# Patient Record
Sex: Female | Born: 1955 | Race: White | Hispanic: No | Smoking: Never smoker
Health system: Southern US, Community
[De-identification: ages and names within clinical notes are randomized; demographics above are authoritative.]

## PROBLEM LIST (undated history)

## (undated) DIAGNOSIS — I200 Unstable angina: Secondary | ICD-10-CM

## (undated) DIAGNOSIS — D649 Anemia, unspecified: Secondary | ICD-10-CM

## (undated) DIAGNOSIS — M1990 Unspecified osteoarthritis, unspecified site: Secondary | ICD-10-CM

## (undated) DIAGNOSIS — E785 Hyperlipidemia, unspecified: Secondary | ICD-10-CM

## (undated) DIAGNOSIS — K5900 Constipation, unspecified: Secondary | ICD-10-CM

## (undated) DIAGNOSIS — M549 Dorsalgia, unspecified: Secondary | ICD-10-CM

## (undated) DIAGNOSIS — I10 Essential (primary) hypertension: Secondary | ICD-10-CM

## (undated) DIAGNOSIS — I639 Cerebral infarction, unspecified: Secondary | ICD-10-CM

## (undated) DIAGNOSIS — H269 Unspecified cataract: Secondary | ICD-10-CM

## (undated) HISTORY — DX: Unspecified cataract: H26.9

## (undated) HISTORY — DX: Cerebral infarction, unspecified: I63.9

## (undated) HISTORY — DX: Essential (primary) hypertension: I10

## (undated) HISTORY — DX: Unspecified osteoarthritis, unspecified site: M19.90

## (undated) HISTORY — DX: Unstable angina: I20.0

## (undated) HISTORY — DX: Anemia, unspecified: D64.9

## (undated) HISTORY — DX: Constipation, unspecified: K59.00

## (undated) HISTORY — DX: Hyperlipidemia, unspecified: E78.5

## (undated) HISTORY — PX: APPENDECTOMY: SHX54

## (undated) HISTORY — DX: Dorsalgia, unspecified: M54.9

---

## 2018-03-22 DIAGNOSIS — M9903 Segmental and somatic dysfunction of lumbar region: Secondary | ICD-10-CM | POA: Diagnosis not present

## 2018-03-22 DIAGNOSIS — M9902 Segmental and somatic dysfunction of thoracic region: Secondary | ICD-10-CM | POA: Diagnosis not present

## 2018-03-22 DIAGNOSIS — M5137 Other intervertebral disc degeneration, lumbosacral region: Secondary | ICD-10-CM | POA: Diagnosis not present

## 2018-03-22 DIAGNOSIS — M5432 Sciatica, left side: Secondary | ICD-10-CM | POA: Diagnosis not present

## 2018-03-22 DIAGNOSIS — S29012A Strain of muscle and tendon of back wall of thorax, initial encounter: Secondary | ICD-10-CM | POA: Diagnosis not present

## 2018-03-22 DIAGNOSIS — M9905 Segmental and somatic dysfunction of pelvic region: Secondary | ICD-10-CM | POA: Diagnosis not present

## 2018-03-30 DIAGNOSIS — M9905 Segmental and somatic dysfunction of pelvic region: Secondary | ICD-10-CM | POA: Diagnosis not present

## 2018-03-30 DIAGNOSIS — S29012A Strain of muscle and tendon of back wall of thorax, initial encounter: Secondary | ICD-10-CM | POA: Diagnosis not present

## 2018-03-30 DIAGNOSIS — M5432 Sciatica, left side: Secondary | ICD-10-CM | POA: Diagnosis not present

## 2018-03-30 DIAGNOSIS — M5137 Other intervertebral disc degeneration, lumbosacral region: Secondary | ICD-10-CM | POA: Diagnosis not present

## 2018-03-30 DIAGNOSIS — M9902 Segmental and somatic dysfunction of thoracic region: Secondary | ICD-10-CM | POA: Diagnosis not present

## 2018-03-30 DIAGNOSIS — M9903 Segmental and somatic dysfunction of lumbar region: Secondary | ICD-10-CM | POA: Diagnosis not present

## 2018-04-07 DIAGNOSIS — M9905 Segmental and somatic dysfunction of pelvic region: Secondary | ICD-10-CM | POA: Diagnosis not present

## 2018-04-07 DIAGNOSIS — M9902 Segmental and somatic dysfunction of thoracic region: Secondary | ICD-10-CM | POA: Diagnosis not present

## 2018-04-07 DIAGNOSIS — M5137 Other intervertebral disc degeneration, lumbosacral region: Secondary | ICD-10-CM | POA: Diagnosis not present

## 2018-04-07 DIAGNOSIS — M5432 Sciatica, left side: Secondary | ICD-10-CM | POA: Diagnosis not present

## 2018-04-07 DIAGNOSIS — S29012A Strain of muscle and tendon of back wall of thorax, initial encounter: Secondary | ICD-10-CM | POA: Diagnosis not present

## 2018-04-07 DIAGNOSIS — M9903 Segmental and somatic dysfunction of lumbar region: Secondary | ICD-10-CM | POA: Diagnosis not present

## 2018-04-09 NOTE — H&P (View-Only) (Signed)
Cardiology Office Note   Date:  04/10/2018   ID:  Vicki Hall, DOB 11-07-1955, MRN [id number]  PCP:  Patient, No Pcp Per  Cardiologist:   No primary care provider on file. Referring:  Self  Chief Complaint  Patient presents with  . Chest Pain      History of Present Illness: Vicki Hall is a 62 y.o. female who presents for evaluation  of chest pain and abnormal stress test.  She is from [place] and this history was obtained through an interpreter speaking Arabic.  She is had a history of chest discomfort and she brings with her a stress test dating back to 2012 which suggested depression in the lateral leads positive for ischemia.  There was a well-preserved ejection fraction.  There was a positive result for hypertension.  This study did not suggest ischemia on perfusion testing but subsequent testing done last year did although I do not have these records.  She does have an abnormal EKG as described below.  This was done to evaluate chest discomfort and tiredness.  He gets a discomfort in her chest with activities or sometimes at rest.  It is difficult to quantify or qualify.  She is had numbness in her left hand as well.  She is very weak trying to do activities and this is been progressive.  She might get short of breath.  Is not a description of exercise-induced nausea vomiting or diaphoresis.  Does not seem to be radiation to the jaw.  She was told that she needed cardiac catheterization.  Her children live here and she wanted to have it done in the [place].     Past Medical History:  Diagnosis Date  . Back pain   . Hypertension    Past Surgical History:  Procedure Laterality Date  . APPENDECTOMY       Current Outpatient Medications  Medication Sig Dispense Refill  . bisoprolol-hydrochlorothiazide (ZIAC) 2.5-6.25 MG tablet Take 2 tablets by mouth daily.    . nitroGLYCERIN 2.5 MG CR capsule Take 2.5 mg by mouth daily.     No current facility-administered medications for this  visit.     Allergies:   Patient has no allergy information on record.    Social History:  The patient  reports that she has never smoked. She has never used smokeless tobacco.   Family History:  The patient's family history includes Liver cancer in her father; Pancreatic cancer in her mother.    ROS:  Please see the history of present illness.   Otherwise, review of systems are positive for none.   All other systems are reviewed and negative.    PHYSICAL EXAM: VS:  BP 134/64   Pulse 74   Ht 5' 5.5" (1.664 m)   Wt 178 lb 12.8 oz (81.1 kg)   BMI 29.30 kg/m  , BMI Body mass index is 29.3 kg/m. GENERAL:  Well appearing HEENT:  Pupils equal round and reactive, fundi not visualized, oral mucosa unremarkable NECK:  No jugular venous distention, waveform within normal limits, carotid upstroke brisk and symmetric, no bruits, no thyromegaly LYMPHATICS:  No cervical, inguinal adenopathy LUNGS:  Clear to auscultation bilaterally BACK:  No CVA tenderness CHEST:  Unremarkable HEART:  PMI not displaced or sustained,S1 and S2 within normal limits, no S3, no S4, no clicks, no rubs, no murmurs ABD:  Flat, positive bowel sounds normal in frequency in pitch, no bruits, no rebound, no guarding, no midline pulsatile mass, no hepatomegaly, no splenomegaly  EXT:  2 plus pulses throughout, no edema, no cyanosis no clubbing SKIN:  No rashes no nodules NEURO:  Cranial nerves II through XII grossly intact, motor grossly intact throughout PSYCH:  Cognitively intact, oriented to person place and time    EKG:  EKG is ordered today. The ekg ordered today demonstrates sinus rhythm, rate 60, axis within normal limits, intervals within normal limits, anterolateral T wave inversion consistent with ischemia but without old EKG for comparison.   Recent Labs: No results found for requested labs within last 8760 hours.    Lipid Panel No results found for: CHOL, TRIG, HDL, CHOLHDL, VLDL, LDLCALC, LDLDIRECT     Wt Readings from Last 3 Encounters:  04/10/18 178 lb 12.8 oz (81.1 kg)      Other studies Reviewed: Additional studies/ records that were reviewed today include: Records from [facility] Review of the above records demonstrates:  Please see elsewhere in the note.     ASSESSMENT AND PLAN:  CHEST PAIN: She has chest discomfort, abnormal EKG and ischemia on perfusion study.  Cardiac catheterization is indicated.  She is not having active symptoms so we can do this electively.  The patient understands that risks included but are not limited to stroke (1 in 1000), death (1 in 40), kidney failure [usually temporary] (1 in 500), bleeding (1 in 200), allergic reaction [possibly serious] (1 in 200).  The patient understands and agrees to proceed.   Of note, though not   HTN:  The blood pressure is at target. No change in medications is indicated. We will continue with therapeutic lifestyle changes (TLC).  DSYLIPIDEMIA: I was able to get results from [place] and LDL was 151.  She can be started on statin based on the results of her catheterization.  I will defer until that study.  Current medicines are reviewed at length with the patient today.  The patient does not have concerns regarding medicines.  The following changes have been made:  no change  Labs/ tests ordered today include:   Orders Placed This Encounter  Procedures  . CBC  . Basic Metabolic Panel (BMET)  . TSH     Disposition:   FU with me based on the results.     Signed, Minus Breeding, MD  04/10/2018 12:40 PM    [place]

## 2018-04-09 NOTE — Progress Notes (Signed)
Cardiology Office Note   Date:  04/10/2018   ID:  Vicki Hall, DOB May 30, 1956, MRN [id number]  PCP:  Patient, No Pcp Per  Cardiologist:   No primary care provider on file. Referring:  Self  Chief Complaint  Patient presents with  . Chest Pain      History of Present Illness: Vicki Hall is a 62 y.o. female who presents for evaluation  of chest pain and abnormal stress test.  She is from [place] and this history was obtained through an interpreter speaking Arabic.  She is had a history of chest discomfort and she brings with her a stress test dating back to 2012 which suggested depression in the lateral leads positive for ischemia.  There was a well-preserved ejection fraction.  There was a positive result for hypertension.  This study did not suggest ischemia on perfusion testing but subsequent testing done last year did although I do not have these records.  She does have an abnormal EKG as described below.  This was done to evaluate chest discomfort and tiredness.  He gets a discomfort in her chest with activities or sometimes at rest.  It is difficult to quantify or qualify.  She is had numbness in her left hand as well.  She is very weak trying to do activities and this is been progressive.  She might get short of breath.  Is not a description of exercise-induced nausea vomiting or diaphoresis.  Does not seem to be radiation to the jaw.  She was told that she needed cardiac catheterization.  Her children live here and she wanted to have it done in the [place].     Past Medical History:  Diagnosis Date  . Back pain   . Hypertension    Past Surgical History:  Procedure Laterality Date  . APPENDECTOMY       Current Outpatient Medications  Medication Sig Dispense Refill  . bisoprolol-hydrochlorothiazide (ZIAC) 2.5-6.25 MG tablet Take 2 tablets by mouth daily.    . nitroGLYCERIN 2.5 MG CR capsule Take 2.5 mg by mouth daily.     No current facility-administered medications for this  visit.     Allergies:   Patient has no allergy information on record.    Social History:  The patient  reports that she has never smoked. She has never used smokeless tobacco.   Family History:  The patient's family history includes Liver cancer in her father; Pancreatic cancer in her mother.    ROS:  Please see the history of present illness.   Otherwise, review of systems are positive for none.   All other systems are reviewed and negative.    PHYSICAL EXAM: VS:  BP 134/64   Pulse 74   Ht 5' 5.5" (1.664 m)   Wt 178 lb 12.8 oz (81.1 kg)   BMI 29.30 kg/m  , BMI Body mass index is 29.3 kg/m. GENERAL:  Well appearing HEENT:  Pupils equal round and reactive, fundi not visualized, oral mucosa unremarkable NECK:  No jugular venous distention, waveform within normal limits, carotid upstroke brisk and symmetric, no bruits, no thyromegaly LYMPHATICS:  No cervical, inguinal adenopathy LUNGS:  Clear to auscultation bilaterally BACK:  No CVA tenderness CHEST:  Unremarkable HEART:  PMI not displaced or sustained,S1 and S2 within normal limits, no S3, no S4, no clicks, no rubs, no murmurs ABD:  Flat, positive bowel sounds normal in frequency in pitch, no bruits, no rebound, no guarding, no midline pulsatile mass, no hepatomegaly, no splenomegaly  EXT:  2 plus pulses throughout, no edema, no cyanosis no clubbing SKIN:  No rashes no nodules NEURO:  Cranial nerves II through XII grossly intact, motor grossly intact throughout PSYCH:  Cognitively intact, oriented to person place and time    EKG:  EKG is ordered today. The ekg ordered today demonstrates sinus rhythm, rate 60, axis within normal limits, intervals within normal limits, anterolateral T wave inversion consistent with ischemia but without old EKG for comparison.   Recent Labs: No results found for requested labs within last 8760 hours.    Lipid Panel No results found for: CHOL, TRIG, HDL, CHOLHDL, VLDL, LDLCALC, LDLDIRECT     Wt Readings from Last 3 Encounters:  04/10/18 178 lb 12.8 oz (81.1 kg)      Other studies Reviewed: Additional studies/ records that were reviewed today include: Records from [facility] Review of the above records demonstrates:  Please see elsewhere in the note.     ASSESSMENT AND PLAN:  CHEST PAIN: She has chest discomfort, abnormal EKG and ischemia on perfusion study.  Cardiac catheterization is indicated.  She is not having active symptoms so we can do this electively.  The patient understands that risks included but are not limited to stroke (1 in 1000), death (1 in 31), kidney failure [usually temporary] (1 in 500), bleeding (1 in 200), allergic reaction [possibly serious] (1 in 200).  The patient understands and agrees to proceed.   Of note, though not   HTN:  The blood pressure is at target. No change in medications is indicated. We will continue with therapeutic lifestyle changes (TLC).  DSYLIPIDEMIA: I was able to get results from [place] and LDL was 151.  She can be started on statin based on the results of her catheterization.  I will defer until that study.  Current medicines are reviewed at length with the patient today.  The patient does not have concerns regarding medicines.  The following changes have been made:  no change  Labs/ tests ordered today include:   Orders Placed This Encounter  Procedures  . CBC  . Basic Metabolic Panel (BMET)  . TSH     Disposition:   FU with me based on the results.     Signed, Minus Breeding, MD  04/10/2018 12:40 PM    [place]

## 2018-04-10 ENCOUNTER — Ambulatory Visit (INDEPENDENT_AMBULATORY_CARE_PROVIDER_SITE_OTHER): Payer: Medicaid Other | Admitting: Cardiology

## 2018-04-10 ENCOUNTER — Encounter: Payer: Self-pay | Admitting: Cardiology

## 2018-04-10 VITALS — BP 134/64 | HR 74 | Ht 65.5 in | Wt 178.8 lb

## 2018-04-10 DIAGNOSIS — R5383 Other fatigue: Secondary | ICD-10-CM | POA: Diagnosis not present

## 2018-04-10 DIAGNOSIS — I10 Essential (primary) hypertension: Secondary | ICD-10-CM | POA: Diagnosis not present

## 2018-04-10 DIAGNOSIS — Z01812 Encounter for preprocedural laboratory examination: Secondary | ICD-10-CM | POA: Diagnosis not present

## 2018-04-10 DIAGNOSIS — E785 Hyperlipidemia, unspecified: Secondary | ICD-10-CM

## 2018-04-10 DIAGNOSIS — I200 Unstable angina: Secondary | ICD-10-CM

## 2018-04-10 DIAGNOSIS — R9439 Abnormal result of other cardiovascular function study: Secondary | ICD-10-CM

## 2018-04-10 LAB — CBC
Hematocrit: 42.1 % (ref 34.0–46.6)
Hemoglobin: 14 g/dL (ref 11.1–15.9)
MCH: 26.8 pg (ref 26.6–33.0)
MCHC: 33.3 g/dL (ref 31.5–35.7)
MCV: 81 fL (ref 79–97)
PLATELETS: 290 10*3/uL (ref 150–450)
RBC: 5.23 x10E6/uL (ref 3.77–5.28)
RDW: 15.1 % (ref 12.3–15.4)
WBC: 4.4 10*3/uL (ref 3.4–10.8)

## 2018-04-10 LAB — BASIC METABOLIC PANEL
BUN/Creatinine Ratio: 20 (ref 12–28)
BUN: 19 mg/dL (ref 8–27)
CALCIUM: 10 mg/dL (ref 8.7–10.3)
CHLORIDE: 98 mmol/L (ref 96–106)
CO2: 25 mmol/L (ref 20–29)
Creatinine, Ser: 0.93 mg/dL (ref 0.57–1.00)
GFR calc Af Amer: 77 mL/min/{1.73_m2} (ref >59)
GFR calc non Af Amer: 67 mL/min/{1.73_m2} (ref >59)
GLUCOSE: 105 mg/dL — AB (ref 65–99)
Potassium: 3.4 mmol/L — ABNORMAL LOW (ref 3.5–5.2)
Sodium: 140 mmol/L (ref 134–144)

## 2018-04-10 LAB — TSH: TSH: 3.49 u[IU]/mL (ref 0.450–4.500)

## 2018-04-10 NOTE — Patient Instructions (Signed)
Medication Instructions:  Continue current medications  If you need a refill on your cardiac medications before your next appointment, please call your pharmacy.  Labwork: Pre Op Labs HERE IN OUR OFFICE AT [facility]  Take the provided lab slips with you to the lab for your blood draw.   You will NOT need to fast   Testing/Procedures: Your physician has requested that you have a cardiac catheterization. Cardiac catheterization is used to diagnose and/or treat various heart conditions. Doctors may recommend this procedure for a number of different reasons. The most common reason is to evaluate chest pain. Chest pain can be a symptom of coronary artery disease (CAD), and cardiac catheterization can show whether plaque is narrowing or blocking your heart's arteries. This procedure is also used to evaluate the valves, as well as measure the blood flow and oxygen levels in different parts of your heart. For further information please visit [email or web address]. Please follow instruction sheet, as given.   Special Instructions:    [name] [place] [address] Dept: [phone] Loc: [phone]  Vicki Hall  04/10/2018  You are scheduled for a Cardiac Catheterization on Thursday, October 3 with Dr. Harrell Gave End.  1. Please arrive at the [facility] (Main Entrance A) at [facility]: [place], [place] [address] at 7:00 AM (This time is two hours before your procedure to ensure your preparation). Free valet parking service is available.   Special note: Every effort is made to have your procedure done on time. Please understand that emergencies sometimes delay scheduled procedures.  2. Diet: Do not eat solid foods after midnight.  The patient may have clear liquids until 5am upon the day of the procedure.  3. Labs: You will need to have blood drawn on Monday,  September 30 at [place]  Open: 8am - 5pm (Lunch 12:30 - 1:30)   Phone: [phone]. You do not need to be fasting.  4. Medication instructions in preparation for your procedure:   Contrast Allergy: No    On the morning of your procedure, take your morning medicines NOT listed above.  You may use sips of water.  5. Plan for one night stay--bring personal belongings. 6. Bring a current list of your medications and current insurance cards. 7. You MUST have a responsible person to drive you home. 8. Someone MUST be with you the first 24 hours after you arrive home or your discharge will be delayed. 9. Please wear clothes that are easy to get on and off and wear slip-on shoes.  Thank you for allowing [place] to care for you!   -- [facility]   Follow-Up: Your physician wants you to follow-up in: After Cath.      Thank you for choosing [facility] at [facility]!!

## 2018-04-11 ENCOUNTER — Telehealth: Payer: Self-pay | Admitting: *Deleted

## 2018-04-11 MED ORDER — POTASSIUM CHLORIDE CRYS ER 20 MEQ PO TBCR: EXTENDED_RELEASE_TABLET | ORAL | 0 refills | Status: DC

## 2018-04-11 NOTE — Addendum Note (Signed)
Addended by: Katrine Coho on: 04/11/2018 03:49 PM   Modules accepted: Orders

## 2018-04-11 NOTE — Telephone Encounter (Signed)
I spoke with patient's daughter, London Sheer ([place]), she is unable to be off work Architectural technologist.  Dr End is aware cath has been changed back to Thursday April 13, 2018 arrive 7 AM for 9 AM cath (original date/time), Santiago Glad in [facility] has requested Arabic interpreter for this date/time.   I reviewed all instructions with London Sheer, she verbalized understanding, thanked me for call.

## 2018-04-11 NOTE — Telephone Encounter (Signed)
BMP done 04/10/18 K 3.4- Kdur 40 meq x 1 will be OK at the time of the cath. Thanks. Maylon Cos   I have spoken to pt's daughter, London Sheer, ([place]), I will send in a prescription for Kdur 40 meq (oral, confirmed with Dr Percival Spanish) for pt to take just prior to leaving for hospital morning of procedure.

## 2018-04-11 NOTE — Telephone Encounter (Signed)
I received message below from Dr End in a staff message:  Could you see if the patient is able to move her cath to tomorrow at 2 PM with Dr. Fletcher Anon? She was initially scheduled with me on Thursday morning. However, she is [place] and speak Arabic; Drs. Hochrein and Arida agree that it might be better for her to be cathed by Dr. Fletcher Anon, given that he speak Arabic.   I called  patient to reschedule cath from April 13, 2018 with Dr End to April 12, 2018 2 PM with Dr Fletcher Anon.  No answer at phone number listed, left message on voice mail for pt to call me back through [facility], [place] Gong.  Pt contacted pre-catheterization scheduled at [facility] for: Wednesday April 12, 2018 2 PM Verified arrival time and place: [place] Entrance A at: 12 noon  No solid food after midnight prior to cath, clear liquids until 5 AM day of procedure. Review allergies. Verify no diabetes medications.  Hold: Bisoprolol-HCTZ -AM of procedure.   Except hold medications AM meds can be  taken pre-cath with sip of water including: ASA 81 mg  Confirm patient has responsible person to drive home post procedure and for 24 hours after you arrive home.

## 2018-04-13 ENCOUNTER — Ambulatory Visit (HOSPITAL_COMMUNITY)
Admission: RE | Admit: 2018-04-13 | Discharge: 2018-04-13 | Disposition: A | Discharge status: Home / Self Care | Payer: Medicaid Other | Source: Ambulatory Visit | Attending: Internal Medicine | Admitting: Internal Medicine

## 2018-04-13 ENCOUNTER — Encounter (HOSPITAL_COMMUNITY)
Admission: RE | Disposition: A | Discharge status: Home / Self Care | Payer: Self-pay | Source: Ambulatory Visit | Attending: Internal Medicine

## 2018-04-13 ENCOUNTER — Other Ambulatory Visit: Payer: Self-pay

## 2018-04-13 DIAGNOSIS — Z9889 Other specified postprocedural states: Secondary | ICD-10-CM | POA: Diagnosis not present

## 2018-04-13 DIAGNOSIS — R9439 Abnormal result of other cardiovascular function study: Secondary | ICD-10-CM

## 2018-04-13 DIAGNOSIS — I200 Unstable angina: Secondary | ICD-10-CM

## 2018-04-13 DIAGNOSIS — R200 Anesthesia of skin: Secondary | ICD-10-CM | POA: Insufficient documentation

## 2018-04-13 DIAGNOSIS — I10 Essential (primary) hypertension: Secondary | ICD-10-CM | POA: Diagnosis not present

## 2018-04-13 HISTORY — PX: LEFT HEART CATH AND CORONARY ANGIOGRAPHY: CATH118249

## 2018-04-13 LAB — POTASSIUM: POTASSIUM: 2.9 mmol/L — AB (ref 3.5–5.1)

## 2018-04-13 SURGERY — LEFT HEART CATH AND CORONARY ANGIOGRAPHY
Anesthesia: LOCAL

## 2018-04-13 MED ORDER — SODIUM CHLORIDE 0.9 % IV SOLN: 250.0000 mL | INTRAVENOUS | Status: DC | PRN

## 2018-04-13 MED ORDER — SODIUM CHLORIDE 0.9% FLUSH: 3.0000 mL | INTRAVENOUS | Status: DC | PRN

## 2018-04-13 MED ORDER — SODIUM CHLORIDE 0.9% FLUSH: 3.0000 mL | Freq: Two times a day (BID) | INTRAVENOUS | Status: DC

## 2018-04-13 MED ORDER — LIDOCAINE HCL (PF) 1 % IJ SOLN
INTRAMUSCULAR | Status: DC | PRN
  Administered 2018-04-13: 2 mL

## 2018-04-13 MED ORDER — HEPARIN (PORCINE) IN NACL 1000-0.9 UT/500ML-% IV SOLN
INTRAVENOUS | Status: AC
  Filled 2018-04-13: qty 1000

## 2018-04-13 MED ORDER — SODIUM CHLORIDE 0.9 % IV SOLN: INTRAVENOUS | Status: DC

## 2018-04-13 MED ORDER — FENTANYL CITRATE (PF) 100 MCG/2ML IJ SOLN
INTRAMUSCULAR | Status: DC | PRN
  Administered 2018-04-13 (×2): 25 ug via INTRAVENOUS

## 2018-04-13 MED ORDER — POTASSIUM CHLORIDE CRYS ER 20 MEQ PO TBCR
EXTENDED_RELEASE_TABLET | ORAL | Status: AC
  Filled 2018-04-13: qty 2

## 2018-04-13 MED ORDER — FENTANYL CITRATE (PF) 100 MCG/2ML IJ SOLN
INTRAMUSCULAR | Status: AC
  Filled 2018-04-13: qty 2

## 2018-04-13 MED ORDER — SODIUM CHLORIDE 0.9 % WEIGHT BASED INFUSION: 1.0000 mL/kg/h | INTRAVENOUS | Status: DC

## 2018-04-13 MED ORDER — ACETAMINOPHEN 325 MG PO TABS: 650.0000 mg | ORAL_TABLET | ORAL | Status: DC | PRN

## 2018-04-13 MED ORDER — MIDAZOLAM HCL 2 MG/2ML IJ SOLN
INTRAMUSCULAR | Status: AC
  Filled 2018-04-13: qty 2

## 2018-04-13 MED ORDER — ONDANSETRON HCL 4 MG/2ML IJ SOLN: 4.0000 mg | Freq: Four times a day (QID) | INTRAMUSCULAR | Status: DC | PRN

## 2018-04-13 MED ORDER — MIDAZOLAM HCL 2 MG/2ML IJ SOLN
INTRAMUSCULAR | Status: DC | PRN
  Administered 2018-04-13 (×2): 1 mg via INTRAVENOUS

## 2018-04-13 MED ORDER — HEPARIN (PORCINE) IN NACL 1000-0.9 UT/500ML-% IV SOLN
INTRAVENOUS | Status: DC | PRN
  Administered 2018-04-13 (×2): 500 mL

## 2018-04-13 MED ORDER — LIDOCAINE HCL (PF) 1 % IJ SOLN
INTRAMUSCULAR | Status: AC
  Filled 2018-04-13: qty 30

## 2018-04-13 MED ORDER — VERAPAMIL HCL 2.5 MG/ML IV SOLN
INTRAVENOUS | Status: DC | PRN
  Administered 2018-04-13: 10 mL via INTRA_ARTERIAL

## 2018-04-13 MED ORDER — SODIUM CHLORIDE 0.9 % WEIGHT BASED INFUSION
3.0000 mL/kg/h | INTRAVENOUS | Status: AC
  Administered 2018-04-13: 3 mL/kg/h via INTRAVENOUS

## 2018-04-13 MED ORDER — IOHEXOL 350 MG/ML SOLN
INTRAVENOUS | Status: DC | PRN
  Administered 2018-04-13: 55 mL via INTRA_ARTERIAL

## 2018-04-13 MED ORDER — VERAPAMIL HCL 2.5 MG/ML IV SOLN
INTRAVENOUS | Status: AC
  Filled 2018-04-13: qty 2

## 2018-04-13 MED ORDER — ASPIRIN 81 MG PO CHEW
81.0000 mg | CHEWABLE_TABLET | ORAL | Status: AC
  Administered 2018-04-13: 81 mg via ORAL
  Filled 2018-04-13: qty 1

## 2018-04-13 MED ORDER — HEPARIN SODIUM (PORCINE) 1000 UNIT/ML IJ SOLN
INTRAMUSCULAR | Status: DC | PRN
  Administered 2018-04-13: 4000 [IU] via INTRAVENOUS

## 2018-04-13 MED ORDER — POTASSIUM CHLORIDE CRYS ER 20 MEQ PO TBCR
40.0000 meq | EXTENDED_RELEASE_TABLET | Freq: Once | ORAL | Status: AC
  Administered 2018-04-13: 40 meq via ORAL
  Filled 2018-04-13 (×2): qty 2

## 2018-04-13 SURGICAL SUPPLY — 10 items

## 2018-04-13 NOTE — Discharge Instructions (Signed)

## 2018-04-13 NOTE — Progress Notes (Signed)
TRB removed gauze with tegaderm placed.  Armboard placed.  Site unremarkable.  Will continue to monitor

## 2018-04-13 NOTE — Interval H&P Note (Signed)
History and Physical Interval Note:  04/13/2018 12:32 PM  Amariana Takacs  has presented today for cardiac catheterization, with the diagnosis of unstable angina. The various methods of treatment have been discussed with the patient and family. After consideration of risks, benefits and other options for treatment, the patient has consented to  Procedure(s): LEFT HEART CATH AND CORONARY ANGIOGRAPHY (N/A) as a surgical intervention .  The patient's history has been reviewed, patient examined, no change in status, stable for surgery.  I have reviewed the patient's chart and labs.  Questions were answered to the patient's satisfaction.    Cath Lab Visit (complete for each Cath Lab visit)  Clinical Evaluation Leading to the Procedure:   ACS: Yes.    Non-ACS:    Anginal Classification: CCS IV  Anti-ischemic medical therapy: Minimal Therapy (1 class of medications)  Non-Invasive Test Results: Intermediate-risk stress test findings: cardiac mortality 1-3%/year  Prior CABG: No previous CABG  Ary Rudnick

## 2018-04-14 ENCOUNTER — Encounter (HOSPITAL_COMMUNITY): Payer: Self-pay | Admitting: Internal Medicine

## 2018-04-17 ENCOUNTER — Telehealth: Payer: Self-pay | Admitting: *Deleted

## 2018-04-17 DIAGNOSIS — I422 Other hypertrophic cardiomyopathy: Secondary | ICD-10-CM

## 2018-04-17 NOTE — Telephone Encounter (Signed)
Call and spoke with pt, letting her know someone from our scheduling department will be calling to schedule pt for an Echo, pt voice understanding. Order for Echo placed

## 2018-04-17 NOTE — Telephone Encounter (Signed)
-----   Message from Minus Breeding, MD sent at 04/14/2018  6:49 PM EDT ----- Please call and schedule an echo and follow up with me after that test.  Thanks.    ----- Message ----- From: Nelva Bush, MD Sent: 04/13/2018   1:37 PM EDT To: Minus Breeding, MD  Clearnce Sorrel,  Ms. Charon's daughter was unable to bring her in yesterday to be cathed by [facility].  She did fine during the case.  Her cors are clean.  LVEF is hyperdynamic.  LVgram raises the possibility of apical HCM, which could explain some of her symptoms and EKG findings.  May be worthwhile getting an echo.  I did not make any medication changes.  Gerald Stabs

## 2018-04-18 ENCOUNTER — Ambulatory Visit (HOSPITAL_COMMUNITY): Payer: Medicaid Other | Attending: Cardiology

## 2018-04-18 ENCOUNTER — Other Ambulatory Visit: Payer: Self-pay

## 2018-04-18 DIAGNOSIS — I422 Other hypertrophic cardiomyopathy: Secondary | ICD-10-CM | POA: Insufficient documentation

## 2018-04-21 DIAGNOSIS — S29012A Strain of muscle and tendon of back wall of thorax, initial encounter: Secondary | ICD-10-CM | POA: Diagnosis not present

## 2018-04-21 DIAGNOSIS — M9905 Segmental and somatic dysfunction of pelvic region: Secondary | ICD-10-CM | POA: Diagnosis not present

## 2018-04-21 DIAGNOSIS — M9902 Segmental and somatic dysfunction of thoracic region: Secondary | ICD-10-CM | POA: Diagnosis not present

## 2018-04-21 DIAGNOSIS — M5137 Other intervertebral disc degeneration, lumbosacral region: Secondary | ICD-10-CM | POA: Diagnosis not present

## 2018-04-21 DIAGNOSIS — M5432 Sciatica, left side: Secondary | ICD-10-CM | POA: Diagnosis not present

## 2018-04-21 DIAGNOSIS — M9903 Segmental and somatic dysfunction of lumbar region: Secondary | ICD-10-CM | POA: Diagnosis not present

## 2018-04-27 DIAGNOSIS — M9905 Segmental and somatic dysfunction of pelvic region: Secondary | ICD-10-CM | POA: Diagnosis not present

## 2018-04-27 DIAGNOSIS — S29012A Strain of muscle and tendon of back wall of thorax, initial encounter: Secondary | ICD-10-CM | POA: Diagnosis not present

## 2018-04-27 DIAGNOSIS — M5137 Other intervertebral disc degeneration, lumbosacral region: Secondary | ICD-10-CM | POA: Diagnosis not present

## 2018-04-27 DIAGNOSIS — M9903 Segmental and somatic dysfunction of lumbar region: Secondary | ICD-10-CM | POA: Diagnosis not present

## 2018-04-27 DIAGNOSIS — M5432 Sciatica, left side: Secondary | ICD-10-CM | POA: Diagnosis not present

## 2018-04-27 DIAGNOSIS — M9902 Segmental and somatic dysfunction of thoracic region: Secondary | ICD-10-CM | POA: Diagnosis not present

## 2018-05-05 DIAGNOSIS — M9902 Segmental and somatic dysfunction of thoracic region: Secondary | ICD-10-CM | POA: Diagnosis not present

## 2018-05-05 DIAGNOSIS — M9905 Segmental and somatic dysfunction of pelvic region: Secondary | ICD-10-CM | POA: Diagnosis not present

## 2018-05-05 DIAGNOSIS — M9903 Segmental and somatic dysfunction of lumbar region: Secondary | ICD-10-CM | POA: Diagnosis not present

## 2018-05-05 DIAGNOSIS — M5432 Sciatica, left side: Secondary | ICD-10-CM | POA: Diagnosis not present

## 2018-05-05 DIAGNOSIS — M5137 Other intervertebral disc degeneration, lumbosacral region: Secondary | ICD-10-CM | POA: Diagnosis not present

## 2018-05-05 DIAGNOSIS — S29012A Strain of muscle and tendon of back wall of thorax, initial encounter: Secondary | ICD-10-CM | POA: Diagnosis not present

## 2018-05-17 DIAGNOSIS — M9902 Segmental and somatic dysfunction of thoracic region: Secondary | ICD-10-CM | POA: Diagnosis not present

## 2018-05-17 DIAGNOSIS — M9903 Segmental and somatic dysfunction of lumbar region: Secondary | ICD-10-CM | POA: Diagnosis not present

## 2018-05-17 DIAGNOSIS — M9905 Segmental and somatic dysfunction of pelvic region: Secondary | ICD-10-CM | POA: Diagnosis not present

## 2018-05-17 DIAGNOSIS — M5137 Other intervertebral disc degeneration, lumbosacral region: Secondary | ICD-10-CM | POA: Diagnosis not present

## 2018-05-17 DIAGNOSIS — S29012A Strain of muscle and tendon of back wall of thorax, initial encounter: Secondary | ICD-10-CM | POA: Diagnosis not present

## 2018-05-17 DIAGNOSIS — M5432 Sciatica, left side: Secondary | ICD-10-CM | POA: Diagnosis not present

## 2018-05-22 ENCOUNTER — Ambulatory Visit: Payer: Medicaid Other | Admitting: Nurse Practitioner

## 2018-05-22 ENCOUNTER — Ambulatory Visit
Admission: RE | Admit: 2018-05-22 | Discharge: 2018-05-22 | Disposition: A | Discharge status: Home / Self Care | Payer: Medicaid Other | Source: Ambulatory Visit | Attending: Nurse Practitioner | Admitting: Nurse Practitioner

## 2018-05-22 DIAGNOSIS — R0600 Dyspnea, unspecified: Secondary | ICD-10-CM | POA: Diagnosis not present

## 2018-05-22 DIAGNOSIS — M545 Low back pain, unspecified: Secondary | ICD-10-CM

## 2018-05-22 DIAGNOSIS — I10 Essential (primary) hypertension: Secondary | ICD-10-CM | POA: Diagnosis not present

## 2018-05-22 DIAGNOSIS — G8929 Other chronic pain: Secondary | ICD-10-CM | POA: Diagnosis not present

## 2018-05-22 DIAGNOSIS — Z13228 Encounter for screening for other metabolic disorders: Secondary | ICD-10-CM | POA: Diagnosis not present

## 2018-05-22 DIAGNOSIS — R0609 Other forms of dyspnea: Secondary | ICD-10-CM | POA: Diagnosis not present

## 2018-05-22 DIAGNOSIS — E876 Hypokalemia: Secondary | ICD-10-CM

## 2018-05-22 MED ORDER — BISOPROLOL-HYDROCHLOROTHIAZIDE 2.5-6.25 MG PO TABS: 2.0000 | ORAL_TABLET | Freq: Every day | ORAL | 1 refills | Status: DC

## 2018-05-22 MED ORDER — NITROGLYCERIN ER 2.5 MG PO CPCR: 2.5000 mg | ORAL_CAPSULE | Freq: Every day | ORAL | 1 refills | Status: DC

## 2018-05-22 NOTE — Progress Notes (Signed)
Subjective:     Patient ID: Vicki Hall , female    DOB: 10-12-1955 , 62 y.o.   MRN: [id number]   Chief Complaint  Patient presents with  . Establish Care  . Back Pain    Patient's daughter stated she is having some back pain and it has been causing her some troiuble breathing she also stated her arm was hurting her.  . Hypertension    needs a refill on her meds    HPI  Here to establish care - She had been seeing Dr. Legrand Como chiropractor who referred her here.  She was started on HTN medications in [place] - last year. She has been seen by a cardiologist Dr. Percival Spanish - she had been having difficulty breathing and had cardiac cath on October 9th.  She has been here with her daughter for the last year.  She does not speak any [place], she speaks Arabic.  Her L3 or L4 is inverted.  She has been seen by the chiropractor 10 times already.   She is here today with her daughter who is interpreting for her.  PMH - HTN  [place] -   Left elbow pain - 2-3 weeks. Severe pain.  No medications.  She is having numbness to her hand as well. 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   Back Pain  This is a chronic (Occurring since 2012) problem. The pain is present in the lumbar spine.  Hypertension  This is a chronic problem. The current episode started more than 1 year ago. The problem is unchanged. The problem is controlled. Associated symptoms include shortness of breath. Pertinent negatives include no anxiety, malaise/fatigue or palpitations. Past treatments include beta blockers and diuretics. Compliance problems include diet.  There is no history of angina or kidney disease. There is no history of chronic renal disease.     Past Medical History:  Diagnosis Date  . Back pain   . Hypertension      Family History  Problem Relation Age of Onset  . Pancreatic cancer Mother   . Liver cancer Father      Current Outpatient Medications:  .  bisoprolol-hydrochlorothiazide (ZIAC) 2.5-6.25 MG  tablet, Take 2 tablets by mouth daily., Disp: , Rfl:  .  nitroGLYCERIN 2.5 MG CR capsule, Take 2.5 mg by mouth daily., Disp: , Rfl:    No Known Allergies   Review of Systems  Constitutional: Negative for malaise/fatigue.  Respiratory: Positive for shortness of breath.   Cardiovascular: Negative for palpitations.  Musculoskeletal: Positive for back pain.     There were no vitals filed for this visit. There is no height or weight on file to calculate BMI.   Objective:  Physical Exam  Constitutional: She is oriented to person, place, and time. She appears well-developed and well-nourished.  Cardiovascular: Normal rate, regular rhythm and normal heart sounds.  Pulmonary/Chest: Effort normal and breath sounds normal.  Musculoskeletal: Normal range of motion. She exhibits tenderness. She exhibits no edema.       Lumbar back: She exhibits tenderness. She exhibits no edema.       Left forearm: She exhibits tenderness and swelling.       Arms: Neurological: She is alert and oriented to person, place, and time.  Skin: Skin is warm and dry. Capillary refill takes less than 2 seconds.         Assessment And Plan:      1. Chronic midline low back pain without sciatica  She has  been followed by the chiropractor however this is ineffective  Will refer to orthopedics for further evaluation - Ambulatory referral to Orthopedic Surgery  2. Dyspnea, unspecified type  No abnormal breath sounds  Will check CXR  She has previously been exposed for  - DG Chest 2 View; Future  3. Encounter for screening for metabolic disorder - Hemoglobin A1c  4. Hypokalemia  Her potassium was 2.9 when she seen Dr. Percival Spanish in October  Will recheck potassium today.  - BMP8+eGFR  5. Essential hypertension  Chronic, controlled  Continue with current medications - bisoprolol-hydrochlorothiazide (ZIAC) 2.5-6.25 MG tablet; Take 2 tablets by mouth daily.  Dispense: 160 tablet; Refill: 1 -  nitroGLYCERIN 2.5 MG CR capsule; Take 1 capsule (2.5 mg total) by mouth daily.  Dispense: 90 capsule; Refill: Becker, FNP

## 2018-05-23 LAB — BMP8+EGFR
BUN / CREAT RATIO: 19 (ref 12–28)
BUN: 15 mg/dL (ref 8–27)
CHLORIDE: 102 mmol/L (ref 96–106)
CO2: 24 mmol/L (ref 20–29)
CREATININE: 0.78 mg/dL (ref 0.57–1.00)
Calcium: 9.7 mg/dL (ref 8.7–10.3)
GFR calc Af Amer: 95 mL/min/{1.73_m2} (ref >59)
GFR, EST NON AFRICAN AMERICAN: 82 mL/min/{1.73_m2} (ref >59)
Glucose: 79 mg/dL (ref 65–99)
Potassium: 3.9 mmol/L (ref 3.5–5.2)
SODIUM: 140 mmol/L (ref 134–144)

## 2018-05-23 LAB — HEMOGLOBIN A1C
Est. average glucose Bld gHb Est-mCnc: 126 mg/dL
HEMOGLOBIN A1C: 6 % — AB (ref 4.8–5.6)

## 2018-05-26 ENCOUNTER — Other Ambulatory Visit: Payer: Self-pay | Admitting: Nurse Practitioner

## 2018-05-26 ENCOUNTER — Other Ambulatory Visit: Payer: Self-pay

## 2018-05-26 DIAGNOSIS — R2232 Localized swelling, mass and lump, left upper limb: Secondary | ICD-10-CM

## 2018-05-26 MED ORDER — ATORVASTATIN CALCIUM 10 MG PO TABS: 10.0000 mg | ORAL_TABLET | Freq: Every day | ORAL | 1 refills | Status: DC

## 2018-05-26 NOTE — Progress Notes (Signed)
ator

## 2018-06-02 ENCOUNTER — Other Ambulatory Visit: Payer: Medicaid Other

## 2018-06-12 ENCOUNTER — Other Ambulatory Visit: Payer: Medicaid Other

## 2018-06-23 ENCOUNTER — Ambulatory Visit (INDEPENDENT_AMBULATORY_CARE_PROVIDER_SITE_OTHER): Payer: Medicaid Other

## 2018-06-23 ENCOUNTER — Encounter (INDEPENDENT_AMBULATORY_CARE_PROVIDER_SITE_OTHER): Payer: Self-pay | Admitting: Orthopaedic Surgery

## 2018-06-23 ENCOUNTER — Ambulatory Visit (INDEPENDENT_AMBULATORY_CARE_PROVIDER_SITE_OTHER): Payer: Medicaid Other | Admitting: Orthopaedic Surgery

## 2018-06-23 ENCOUNTER — Other Ambulatory Visit: Payer: Medicaid Other

## 2018-06-23 VITALS — BP 141/95 | HR 74 | Ht 65.0 in | Wt 184.0 lb

## 2018-06-23 DIAGNOSIS — M545 Low back pain, unspecified: Secondary | ICD-10-CM

## 2018-06-23 DIAGNOSIS — M5136 Other intervertebral disc degeneration, lumbar region: Secondary | ICD-10-CM

## 2018-06-23 DIAGNOSIS — G8929 Other chronic pain: Secondary | ICD-10-CM

## 2018-06-23 NOTE — Progress Notes (Signed)
Office Visit Note   Patient: Vicki Hall           Date of Birth: 11-Feb-1956           MRN: [id number] Visit Date: 06/23/2018              Requested by: Minette Brine, [address] [place], [place] [address] PCP: Minette Brine, FNP   Assessment & Plan: Visit Diagnoses:  1. Chronic left-sided low back pain, unspecified whether sciatica present     Plan: Many year history of low back pain associated with left lower extremity radiculopathy.  Has had a course of chiropractic treatment, over-the-counter medicines, ice and heat.  I think at this point we need to have an MRI scan to delineate the pathology that I suspect is either at L4-5 or L5-S1.  Might be a good candidate for epidural steroid injections.  Total time spent 45 minutes 50% of the time in counseling Follow-Up Instructions: Return after MRI L-S spine.   Orders:  Orders Placed This Encounter  Procedures  . XR Lumbar Spine 2-3 Views   No orders of the defined types were placed in this encounter.     Procedures: No procedures performed   Clinical Data: No additional findings.   Subjective: Chief Complaint  Patient presents with  . Lower Back - Pain  . Back Pain    no injury to back ,using tyleonl for pain , using ice pack at time , numbness in her left leg   62 year old female, native of [place], is accompanied by her daughter who acts as her interpreter.  Has had at least a 10-year history of low back pain that more recently has involved the left lower extremity.  She has had some trouble sleeping with pain.  The longer she is on her feet of the further she walks the more trouble she will experience leg.  Oftentimes she will have to sit and rest.  She has been to the chiropractor without much relief.  She is tried heat and ice and over-the-counter medicines.  She has had some constipation possibly related to the medicines.  No referred pain to the right lower extremity.  No prior injury or  surgery  HPI  Review of Systems  Musculoskeletal: Positive for back pain and gait problem.  Neurological: Positive for weakness.     Objective: Vital Signs: BP (!) 141/95   Pulse 74   Ht 5\' 5"  (1.651 m)   Wt 184 lb (83.5 kg)   BMI 30.62 kg/m   Physical Exam Constitutional:      Appearance: She is well-developed.  Eyes:     Pupils: Pupils are equal, round, and reactive to light.  Pulmonary:     Effort: Pulmonary effort is normal.  Skin:    General: Skin is warm and dry.  Neurological:     Mental Status: She is alert and oriented to person, place, and time.  Psychiatric:        Behavior: Behavior normal.     Ortho Exam ambulates without a limp.  Straight leg raise negative.  Painless range of motion both hips and both knees.  I thought the left ankle reflex was decreased compared to the right.  Good strength.  Normal sensibility.  No percussible tenderness of the lumbar spine Specialty Comments:  No specialty comments available.  Imaging: Xr Lumbar Spine 2-3 Views  Result Date: 06/23/2018 Films of the lumbar spine were obtained in 2 projections.  There is a  grade 2 spondylolisthesis of L4 on L5.  There is narrowing of the disc space at L5-S1.  Some spotty calcification of the abdominal aorta without obvious aneurysmal dilatation.  Facet sclerosis particularly at L5-S1. degenerative curvature to the right of about 7 to 8 degrees with apex in the upper lumbar spine    PMFS History: Patient Active Problem List   Diagnosis Date Noted  . Unstable angina ([place]) 04/10/2018  . Pre-operative laboratory examination 04/10/2018  . Other fatigue 04/10/2018  . Abnormal stress test 04/10/2018  . Dyslipidemia 04/10/2018  . Essential hypertension 04/10/2018   Past Medical History:  Diagnosis Date  . Back pain   . Hypertension     Family History  Problem Relation Age of Onset  . Pancreatic cancer Mother   . Liver cancer Father     Past Surgical History:  Procedure  Laterality Date  . APPENDECTOMY    . LEFT HEART CATH AND CORONARY ANGIOGRAPHY N/A 04/13/2018   Procedure: LEFT HEART CATH AND CORONARY ANGIOGRAPHY;  Surgeon: Nelva Bush, MD;  Location: [facility];  Service: Cardiovascular;  Laterality: N/A;   Social History   Occupational History  . Not on file  Tobacco Use  . Smoking status: Never Smoker  . Smokeless tobacco: Never Used  Substance and Sexual Activity  . Alcohol use: Not on file  . Drug use: Not on file  . Sexual activity: Not on file

## 2018-06-23 NOTE — Addendum Note (Signed)
Addended by: Tyrone Apple on: 06/23/2018 09:38 AM   Modules accepted: Orders

## 2018-06-28 ENCOUNTER — Ambulatory Visit
Admission: RE | Admit: 2018-06-28 | Discharge: 2018-06-28 | Disposition: A | Discharge status: Home / Self Care | Payer: Medicaid Other | Source: Ambulatory Visit | Attending: Nurse Practitioner | Admitting: Nurse Practitioner

## 2018-06-28 DIAGNOSIS — R2232 Localized swelling, mass and lump, left upper limb: Secondary | ICD-10-CM | POA: Diagnosis not present

## 2018-07-20 NOTE — Progress Notes (Signed)
We can refer her to a surgeon to take a look if she is interested.

## 2018-07-26 ENCOUNTER — Ambulatory Visit: Payer: Medicaid Other | Admitting: Nurse Practitioner

## 2019-03-05 ENCOUNTER — Telehealth: Payer: Self-pay

## 2019-03-05 NOTE — Telephone Encounter (Signed)
Patient's daughter called requesting a refill on her med to be sent to a new pharmacy.  I HAVE RETURNED HER CALL TO SCHEDULE HER AN APPOINTMENT I WAS UNABLE TO LEAVE A V/M. Lonia Mad

## 2019-03-26 ENCOUNTER — Other Ambulatory Visit: Payer: Self-pay

## 2019-03-26 DIAGNOSIS — I10 Essential (primary) hypertension: Secondary | ICD-10-CM

## 2019-03-26 MED ORDER — BISOPROLOL-HYDROCHLOROTHIAZIDE 2.5-6.25 MG PO TABS: 2.0000 | ORAL_TABLET | Freq: Every day | ORAL | 0 refills | Status: DC

## 2019-03-26 MED ORDER — ATORVASTATIN CALCIUM 10 MG PO TABS: 10.0000 mg | ORAL_TABLET | Freq: Every day | ORAL | 0 refills | Status: DC

## 2019-03-29 ENCOUNTER — Other Ambulatory Visit: Payer: Self-pay

## 2019-03-29 ENCOUNTER — Encounter: Payer: Self-pay | Admitting: Internal Medicine

## 2019-03-29 ENCOUNTER — Ambulatory Visit (INDEPENDENT_AMBULATORY_CARE_PROVIDER_SITE_OTHER): Payer: Medicaid Other | Admitting: Internal Medicine

## 2019-03-29 VITALS — BP 126/84 | HR 63 | Temp 98.1°F | Ht 64.4 in | Wt 181.6 lb

## 2019-03-29 DIAGNOSIS — M545 Low back pain: Secondary | ICD-10-CM | POA: Diagnosis not present

## 2019-03-29 DIAGNOSIS — G8929 Other chronic pain: Secondary | ICD-10-CM

## 2019-03-29 DIAGNOSIS — K5909 Other constipation: Secondary | ICD-10-CM

## 2019-03-29 DIAGNOSIS — I10 Essential (primary) hypertension: Secondary | ICD-10-CM | POA: Diagnosis not present

## 2019-03-29 NOTE — Progress Notes (Signed)
Subjective:     Patient ID: Vicki Hall , female    DOB: 15-Jan-1956 , 63 y.o.   MRN: [id number]   Chief Complaint  Patient presents with  . Follow-up    b/p  . Constipation    HPI  1-Here for HTN FU. She does not check her BP regularly. The few times she checked was normal. 2- has been having constipation x 1y. The longest she has gone without a BM.     And may take her 4 days to have a BM. Has been taking fiber and water and does not always helps. Laxatives has helped within 30 min. Her daughter who cooks always has vegestables with meals.  3- wants to go back to her back pain ortho. Her prior insurance did not cover xrays ordered then.   Past Medical History:  Diagnosis Date  . Back pain   . Hypertension      Family History  Problem Relation Age of Onset  . Pancreatic cancer Mother   . Liver cancer Father      Current Outpatient Medications:  .  atorvastatin (LIPITOR) 10 MG tablet, Take 1 tablet (10 mg total) by mouth daily., Disp: 4 tablet, Rfl: 0 .  bisoprolol-hydrochlorothiazide (ZIAC) 2.5-6.25 MG tablet, Take 2 tablets by mouth daily., Disp: 8 tablet, Rfl: 0 .  nitroGLYCERIN 2.5 MG CR capsule, Take 1 capsule (2.5 mg total) by mouth daily., Disp: 90 capsule, Rfl: 1   No Known Allergies   Review of Systems  Review of Systems  Constitutional: Negative for diaphoresis and unexpected weight change.  HENT: Negative for tinnitus.   Eyes: Negative for visual disturbance.  Respiratory: Negative for chest tightness and shortness of breath.   Cardiovascular: Negative for chest pain, palpitations and leg swelling.  Gastrointestinal: Negative for constipation, diarrhea and nausea.  Endocrine: Negative for polydipsia, polyphagia and polyuria.  Genitourinary: Negative for dysuria and frequency.  Skin: Negative for rash and wound.  Neurological: Negative for dizziness, speech difficulty, weakness, numbness and headaches.  GI- + constipation, denies abdominal pain  Today's  Vitals   03/29/19 1519  BP: 126/84  Pulse: 63  Temp: 98.1 F (36.7 C)  TempSrc: Oral  Weight: 181 lb 9.6 oz (82.4 kg)  Height: 5' 4.4" (1.636 m)   Body mass index is 30.79 kg/m.   Objective:  Physical Exam   Constitutional: She is oriented to person, place, and time. She appears well-developed and well-nourished. No distress.  HENT:  Head: Normocephalic and atraumatic.  Right Ear: External ear normal.  Left Ear: External ear normal.  Nose: Nose normal.  Eyes: Conjunctivae are normal. Right eye exhibits no discharge. Left eye exhibits no discharge. No scleral icterus.  Neck: Neck supple. No thyromegaly present.  No carotid bruits bilaterally  Cardiovascular: Normal rate and regular rhythm.  No murmur heard. Pulmonary/Chest: Effort normal and breath sounds normal. No respiratory distress. Abd- + BS, soft, has moderate tenderness on R and L abdomen, with no guarding or rebound. No masses palpated.   Musculoskeletal: Normal range of motion. She exhibits no edema.  Lymphadenopathy: he has no cervical adenopathy.  Neurological: She is alert and oriented to person, place, and time.  Skin: Skin is warm and dry. Capillary refill takes less than 2 seconds. No rash noted. She is not diaphoretic.  Psychiatric: She has a normal mood and affect. Her behavior is normal. Judgment and thought content normal.  Nursing note reviewed. Assessment And Plan:  1. Chronic constipation- change in bowel habit and  never had a colonoscopy - Ambulatory referral to Gastroenterology - TSH - T4, Free - T3, free  2. Essential hypertension- stable. May continue same med. FU 3 months.  - CMP14 + Anion Gap - CBC no Diff  3. Chronic midline low back pain without sciatica- chronic.  - Ambulatory referral to Orthopedic Surgery      Vicki Barnhardt Kurten, PA-C    THE PATIENT IS ENCOURAGED TO PRACTICE SOCIAL DISTANCING DUE TO THE COVID-19 PANDEMIC.

## 2019-03-29 NOTE — Patient Instructions (Signed)
  Drink prune juice 4-6 oz every morning. If this does not help, try Colace which is a stool softner and take 100 mg twice a day.      Constipation, Adult Constipation is when a person:  Poops (has a bowel movement) fewer times in a week than normal.  Has a hard time pooping.  Has poop that is dry, hard, or bigger than normal. Follow these instructions at home: Eating and drinking   Eat foods that have a lot of fiber, such as: ? Fresh fruits and vegetables. ? Whole grains. ? Beans.  Eat less of foods that are high in fat, low in fiber, or overly processed, such as: ? [place] fries. ? Hamburgers. ? Cookies. ? Candy. ? Soda.  Drink enough fluid to keep your pee (urine) clear or pale yellow. General instructions  Exercise regularly or as told by your doctor.  Go to the restroom when you feel like you need to poop. Do not hold it in.  Take over-the-counter and prescription medicines only as told by your doctor. These include any fiber supplements.  Do pelvic floor retraining exercises, such as: ? Doing deep breathing while relaxing your lower belly (abdomen). ? Relaxing your pelvic floor while pooping.  Watch your condition for any changes.  Keep all follow-up visits as told by your doctor. This is important. Contact a doctor if:  You have pain that gets worse.  You have a fever.  You have not pooped for 4 days.  You throw up (vomit).  You are not hungry.  You lose weight.  You are bleeding from the anus.  You have thin, pencil-like poop (stool). Get help right away if:  You have a fever, and your symptoms suddenly get worse.  You leak poop or have blood in your poop.  Your belly feels hard or bigger than normal (is bloated).  You have very bad belly pain.  You feel dizzy or you faint. This information is not intended to replace advice given to you by your health care provider. Make sure you discuss any questions you have with your health care  provider. Document Released: 12/15/2007 Document Revised: 06/10/2017 Document Reviewed: 12/17/2015 Elsevier Patient Education  2020 [employer].

## 2019-03-30 LAB — CMP14 + ANION GAP
ALT: 21 IU/L (ref 0–32)
AST: 21 IU/L (ref 0–40)
Albumin/Globulin Ratio: 1.6 (ref 1.2–2.2)
Albumin: 4.3 g/dL (ref 3.8–4.8)
Alkaline Phosphatase: 59 IU/L (ref 39–117)
Anion Gap: 17 mmol/L (ref 10.0–18.0)
BUN/Creatinine Ratio: 16 (ref 12–28)
BUN: 13 mg/dL (ref 8–27)
Bilirubin Total: 0.3 mg/dL (ref 0.0–1.2)
CO2: 23 mmol/L (ref 20–29)
Calcium: 10.1 mg/dL (ref 8.7–10.3)
Chloride: 102 mmol/L (ref 96–106)
Creatinine, Ser: 0.79 mg/dL (ref 0.57–1.00)
GFR calc Af Amer: 93 mL/min/{1.73_m2} (ref >59)
GFR calc non Af Amer: 80 mL/min/{1.73_m2} (ref >59)
Globulin, Total: 2.7 g/dL (ref 1.5–4.5)
Glucose: 95 mg/dL (ref 65–99)
Potassium: 3.6 mmol/L (ref 3.5–5.2)
Sodium: 142 mmol/L (ref 134–144)
Total Protein: 7 g/dL (ref 6.0–8.5)

## 2019-03-30 LAB — CBC
Hematocrit: 43.4 % (ref 34.0–46.6)
Hemoglobin: 13.8 g/dL (ref 11.1–15.9)
MCH: 25.8 pg — ABNORMAL LOW (ref 26.6–33.0)
MCHC: 31.8 g/dL (ref 31.5–35.7)
MCV: 81 fL (ref 79–97)
Platelets: 286 10*3/uL (ref 150–450)
RBC: 5.34 x10E6/uL — ABNORMAL HIGH (ref 3.77–5.28)
RDW: 14.7 % (ref 11.7–15.4)
WBC: 4.8 10*3/uL (ref 3.4–10.8)

## 2019-03-30 LAB — T3, FREE: T3, Free: 2.9 pg/mL (ref 2.0–4.4)

## 2019-03-30 LAB — T4, FREE: Free T4: 1.04 ng/dL (ref 0.82–1.77)

## 2019-03-30 LAB — TSH: TSH: 2.61 u[IU]/mL (ref 0.450–4.500)

## 2019-04-02 ENCOUNTER — Telehealth: Payer: Self-pay

## 2019-04-02 NOTE — Telephone Encounter (Signed)
Called to inform pt of results per Sunday Spillers. Did not get an answer, pt VM full not able to leave message  Rodriguez-Southworth, Sandrea Matte Candiss Norse T, CMA        Please inform pt that her chemistry is normal, cell count does not show anemia, thyroid function is normal.

## 2019-04-10 ENCOUNTER — Encounter: Payer: Self-pay | Admitting: Orthopaedic Surgery

## 2019-04-10 ENCOUNTER — Ambulatory Visit (INDEPENDENT_AMBULATORY_CARE_PROVIDER_SITE_OTHER): Payer: Medicaid Other | Admitting: Orthopaedic Surgery

## 2019-04-10 ENCOUNTER — Other Ambulatory Visit: Payer: Self-pay

## 2019-04-10 DIAGNOSIS — M5126 Other intervertebral disc displacement, lumbar region: Secondary | ICD-10-CM

## 2019-04-10 DIAGNOSIS — M4316 Spondylolisthesis, lumbar region: Secondary | ICD-10-CM

## 2019-04-10 DIAGNOSIS — I10 Essential (primary) hypertension: Secondary | ICD-10-CM

## 2019-04-10 DIAGNOSIS — M4310 Spondylolisthesis, site unspecified: Secondary | ICD-10-CM | POA: Insufficient documentation

## 2019-04-10 MED ORDER — BISOPROLOL-HYDROCHLOROTHIAZIDE 2.5-6.25 MG PO TABS: 2.0000 | ORAL_TABLET | Freq: Every day | ORAL | 0 refills | Status: DC

## 2019-04-10 NOTE — Progress Notes (Signed)
Office Visit Note   Patient: Vicki Hall           Date of Birth: 06/30/1956           MRN: [id number] Visit Date: 04/10/2019              Requested by: Shelby Mattocks, PA-C [address] [place],  [place] [address] PCP: Minette Brine, FNP   Assessment & Plan: Visit Diagnoses:  1. Spondylolisthesis, lumbar region   2. HNP (herniated nucleus pulposus), lumbar      Plan:  #1: Our plan is to have her see Dr. Ernestina Patches for epidural injections. #2: Return back in 4 weeks if not better.  Follow-Up Instructions: Return in about 4 weeks (around 05/08/2019).    Face-to-face time spent with patient was greater than 30 minutes.  Greater than 50% of the time was spent in counseling and coordination of care.  Orders:  No orders of the defined types were placed in this encounter.  No orders of the defined types were placed in this encounter.     Procedures: No procedures performed   Clinical Data: No additional findings.   Subjective: Chief Complaint  Patient presents with  . Lower Back - Pain  Patient presents today for recurrent lower back pain. Her pain is located in her left lower back and travels down her left leg. She takes Advil as needed for pain. She has numbness in her leg. No tingling or weakness. The pain is constant regardless of position or activity. She was last evaluated  Here in December of 2019.   HPI  Vicki Hall is seen today for evaluation of low back pain and left leg radicular type symptoms.  She was refused by her insurance to have an MRI scan of the lumbar spine previously.  She went back to [place] and at that time because of COVID could not get back into the denies states.  She underwent a MRI scan.  This is noted to in the discussion under radiology.  Her symptoms remain in her left leg only.  Pain mostly in the posterior lateral aspect of the leg.  She returns today for reevaluation.  Review of Systems  Constitutional: Negative  for fatigue.  HENT: Negative for ear pain.   Eyes: Negative for pain.  Respiratory: Positive for shortness of breath.   Cardiovascular: Negative for leg swelling.  Gastrointestinal: Positive for constipation. Negative for diarrhea.  Endocrine: Negative for cold intolerance and heat intolerance.  Genitourinary: Negative for difficulty urinating.  Musculoskeletal: Negative for joint swelling.  Skin: Negative for rash.  Allergic/Immunologic: Negative for food allergies.  Neurological: Negative for weakness.  Hematological: Does not bruise/bleed easily.  Psychiatric/Behavioral: Positive for sleep disturbance.     Objective: Vital Signs: BP (!) 144/112   Pulse 72   Ht 5' 4.4" (1.636 m)   Wt 181 lb (82.1 kg)   BMI 30.68 kg/m   Physical Exam Constitutional:      Appearance: Normal appearance. She is well-developed.  HENT:     Head: Normocephalic.  Eyes:     Pupils: Pupils are equal, round, and reactive to light.  Pulmonary:     Effort: Pulmonary effort is normal.  Skin:    General: Skin is warm and dry.  Neurological:     Mental Status: She is alert and oriented to person, place, and time.  Psychiatric:        Behavior: Behavior normal.     Ortho Exam  Exam today reveals  in the sitting position.  She has a mildly positive straight leg raise at 90 degrees on the left negative on the right.  She has good strength in the lower extremities bilateral and symmetric.  Sensation states a little bit decreased in the posterior calf and lateral calf. Smooth motion of both hips.  Knee exam is unremarkable.  Specialty Comments:  No specialty comments available.  Imaging: No results found.  MRI scan performed in [place] reveals mild dorsolumbar scoliotic list to the right side.  First degree degenerative anterior listhesis of L4 over L5 vertebral body and to a lesser extent L5 over S1 secondary to bilateral facet joint osteoarthritis.  Normal sagittal diameter of the bony lumbar spinal  canal.  Mild spondylo-degenerative changes of the lumbar spine are noted in the form of few tiny marginal osteophytes and reduced height and right T2 signal intensity of lumbar disc denoting their degeneration more prominent at L4-5 and 5 S1.  L3-4 mild annular rent relaxation is seen abutting anterior aspect of the thecal.  L4-5 pseudo-diffuse posterior disc bulge is seen mildly indenting the [place] and mildly encroaching upon both neural exit foramina.  L5-S1 has diffuse posterior disc bulge with right posterior lateral disc protrusion is seen indenting the thecal and slightly encroaching upon the right neural exit foramina.  Normal MRI appearance and signal intensity pattern of the lower cord, conus medullaris, and cauda equina nerve roots.  Homogeneous marrow signal intensity pattern of vertebral bodies and their neural arches.  No abnormal paraspinal soft tissue masses.   PMFS History: Current Outpatient Medications  Medication Sig Dispense Refill  . atorvastatin (LIPITOR) 10 MG tablet Take 1 tablet (10 mg total) by mouth daily. 4 tablet 0  . bisoprolol-hydrochlorothiazide (ZIAC) 2.5-6.25 MG tablet Take 2 tablets by mouth daily. 8 tablet 0  . nitroGLYCERIN 2.5 MG CR capsule Take 1 capsule (2.5 mg total) by mouth daily. 90 capsule 1   No current facility-administered medications for this visit.     Patient Active Problem List   Diagnosis Date Noted  . Spondylolisthesis, lumbar region 04/10/2019  . HNP (herniated nucleus pulposus), lumbar 04/10/2019  . Unstable angina ([place]) 04/10/2018  . Pre-operative laboratory examination 04/10/2018  . Other fatigue 04/10/2018  . Abnormal stress test 04/10/2018  . Dyslipidemia 04/10/2018  . Essential hypertension 04/10/2018   Past Medical History:  Diagnosis Date  . Back pain   . Hypertension     Family History  Problem Relation Age of Onset  . Pancreatic cancer Mother   . Liver cancer Father     Past Surgical History:  Procedure Laterality Date   . APPENDECTOMY    . LEFT HEART CATH AND CORONARY ANGIOGRAPHY N/A 04/13/2018   Procedure: LEFT HEART CATH AND CORONARY ANGIOGRAPHY;  Surgeon: Nelva Bush, MD;  Location: [facility];  Service: Cardiovascular;  Laterality: N/A;   Social History   Occupational History  . Not on file  Tobacco Use  . Smoking status: Never Smoker  . Smokeless tobacco: Never Used  Substance and Sexual Activity  . Alcohol use: Not on file  . Drug use: Not on file  . Sexual activity: Not on file

## 2019-04-10 NOTE — Addendum Note (Signed)
Addended by: Lendon Collar on: 04/10/2019 09:50 AM   Modules accepted: Orders

## 2019-04-19 ENCOUNTER — Other Ambulatory Visit: Payer: Self-pay

## 2019-04-19 DIAGNOSIS — I10 Essential (primary) hypertension: Secondary | ICD-10-CM

## 2019-04-19 MED ORDER — NITROGLYCERIN ER 2.5 MG PO CPCR: 2.5000 mg | ORAL_CAPSULE | Freq: Every day | ORAL | 1 refills | Status: DC

## 2019-04-19 MED ORDER — ATORVASTATIN CALCIUM 10 MG PO TABS: 10.0000 mg | ORAL_TABLET | Freq: Every day | ORAL | 1 refills | Status: DC

## 2019-04-23 ENCOUNTER — Other Ambulatory Visit: Payer: Self-pay

## 2019-04-23 DIAGNOSIS — I10 Essential (primary) hypertension: Secondary | ICD-10-CM

## 2019-04-23 MED ORDER — BISOPROLOL-HYDROCHLOROTHIAZIDE 2.5-6.25 MG PO TABS: 2.0000 | ORAL_TABLET | Freq: Every day | ORAL | 0 refills | Status: DC

## 2019-05-31 ENCOUNTER — Ambulatory Visit: Payer: Self-pay

## 2019-05-31 ENCOUNTER — Encounter: Payer: Self-pay | Admitting: Physical Medicine and Rehabilitation

## 2019-05-31 ENCOUNTER — Other Ambulatory Visit: Payer: Self-pay

## 2019-05-31 ENCOUNTER — Ambulatory Visit (INDEPENDENT_AMBULATORY_CARE_PROVIDER_SITE_OTHER): Payer: Medicaid Other | Admitting: Physical Medicine and Rehabilitation

## 2019-05-31 VITALS — BP 155/86 | HR 58

## 2019-05-31 DIAGNOSIS — M5416 Radiculopathy, lumbar region: Secondary | ICD-10-CM

## 2019-05-31 MED ORDER — BETAMETHASONE SOD PHOS & ACET 6 (3-3) MG/ML IJ SUSP
12.0000 mg | Freq: Once | INTRAMUSCULAR | Status: AC
  Administered 2019-05-31: 09:00:00 12 mg

## 2019-05-31 NOTE — Progress Notes (Signed)
 .  Numeric Pain Rating Scale and Functional Assessment Average Pain 9   In the last MONTH (on 0-10 scale) has pain interfered with the following?  1. General activity like being  able to carry out your everyday physical activities such as walking, climbing stairs, carrying groceries, or moving a chair?  Rating(9)   +Driver, -BT, -Dye Allergies.  

## 2019-05-31 NOTE — Progress Notes (Signed)
Vicki Hall - 63 y.o. female MRN [id number]  Date of birth: 11/01/1955  Office Visit Note: Visit Date: 05/31/2019 PCP: Minette Brine, FNP Referred by: Minette Brine, FNP  Subjective: Chief Complaint  Patient presents with   Lower Back - Pain   Left Leg - Pain   HPI: Vicki Hall is a 63 y.o. female who comes in today At the request of Dr. Joni Fears for lumbar spine interventional pain procedure.  Patient is having left leg pain and a fairly classic S1 distribution somewhat L5 distribution she has MRI which is reviewed below from a report from [place].  She is followed by Dr. Durward Fortes from a orthopedic standpoint.  She does have a family member acting as interpreter unfortunately but they are doing well and I think this is appropriate at this point.  She has not had prior lumbar injections.  She is having clear radicular pain that has not responded to conservative care through Dr. Durward Fortes.  We are going to complete based on her symptoms a left S1 transforaminal epidural steroid injection.  Her symptoms of been ongoing for several years on again ibuprofen anti-inflammatory do not help.  ROS Otherwise per HPI.  Assessment & Plan: Visit Diagnoses:  1. Lumbar radiculopathy     Plan: No additional findings.   Meds & Orders:  Meds ordered this encounter  Medications   betamethasone acetate-betamethasone sodium phosphate (CELESTONE) injection 12 mg    Orders Placed This Encounter  Procedures   XR C-ARM NO REPORT   Epidural Steroid injection    Follow-up: Return if symptoms worsen or fail to improve.   Procedures: No procedures performed  S1 Lumbosacral Transforaminal Epidural Steroid Injection - Sub-Pedicular Approach with Fluoroscopic Guidance   Patient: Vicki Hall      Date of Birth: 14-Aug-1955 MRN: [id number] PCP: Minette Brine, FNP      Visit Date: 05/31/2019   Universal Protocol:    Date/Time: 05/31/2011:46 PM  Consent Given By: the patient  Position:   PRONE  Additional Comments: Vital signs were monitored before and after the procedure. Patient was prepped and draped in the usual sterile fashion. The correct patient, procedure, and site was verified.   Injection Procedure Details:  Procedure Site One Meds Administered:  Meds ordered this encounter  Medications   betamethasone acetate-betamethasone sodium phosphate (CELESTONE) injection 12 mg    Laterality: Left  Location/Site:  S1 Foramen   Needle size: 22 ga.  Needle type: Spinal  Needle Placement: Transforaminal  Findings:   -Comments: Excellent flow of contrast along the nerve and into the epidural space.  Epidurogram: Contrast epidurogram showed nerve root cut off and restricted flow pattern superior to the L5-S1 pedicle.  Procedure Details: After squaring off the sacral end-plate to get a true AP view, the C-arm was positioned so that the best possible view of the S1 foramen was visualized. The soft tissues overlying this structure were infiltrated with 2-3 ml. of 1% Lidocaine without Epinephrine.    The spinal needle was inserted toward the target using a "trajectory" view along the fluoroscope beam.  Under AP and lateral visualization, the needle was advanced so it did not puncture dura. Biplanar projections were used to confirm position. Aspiration was confirmed to be negative for CSF and/or blood. A 1-2 ml. volume of Isovue-250 was injected and flow of contrast was noted at each level. Radiographs were obtained for documentation purposes.   After attaining the desired flow of contrast documented above, a 0.5 to 1.0 ml test  dose of 0.25% Marcaine was injected into each respective transforaminal space.  The patient was observed for 90 seconds post injection.  After no sensory deficits were reported, and normal lower extremity motor function was noted,   the above injectate was administered so that equal amounts of the injectate were placed at each foramen (level)  into the transforaminal epidural space.   Additional Comments:  The patient tolerated the procedure well Dressing: Band-Aid with 2 x 2 sterile gauze    Post-procedure details: Patient was observed during the procedure. Post-procedure instructions were reviewed.  Patient left the clinic in stable condition.    Clinical History: MRI scan performed in [place] reveals mild dorsolumbar scoliotic list to the right side.  First degree degenerative anterior listhesis of L4 over L5 vertebral body and to a lesser extent L5 over S1 secondary to bilateral facet joint osteoarthritis.  Normal sagittal diameter of the bony lumbar spinal canal.  Mild spondylo-degenerative changes of the lumbar spine are noted in the form of few tiny marginal osteophytes and reduced height and right T2 signal intensity of lumbar disc denoting their degeneration more prominent at L4-5 and 5 S1.  L3-4 mild annular rent relaxation is seen abutting anterior aspect of the thecal.  L4-5 pseudo-diffuse posterior disc bulge is seen mildly indenting the [place] and mildly encroaching upon both neural exit foramina.  L5-S1 has diffuse posterior disc bulge with right posterior lateral disc protrusion is seen indenting the thecal and slightly encroaching upon the right neural exit foramina.  Normal MRI appearance and signal intensity pattern of the lower cord, conus medullaris, and cauda equina nerve roots.  Homogeneous marrow signal intensity pattern of vertebral bodies and their neural arches.  No abnormal paraspinal soft tissue masses.   She reports that she has never smoked. She has never used smokeless tobacco. No results for input(s): HGBA1C, LABURIC in the last 8760 hours.  Objective:  VS:  HT:     WT:    BMI:      BP:(!) 155/86   HR:(!) 58bpm   TEMP: ( )   RESP:  Physical Exam  Ortho Exam Imaging: Xr C-arm No Report  Result Date: 05/31/2019 Please see Notes tab for imaging impression.   Past Medical/Family/Surgical/Social  History: Medications & Allergies reviewed per EMR, new medications updated. Patient Active Problem List   Diagnosis Date Noted   Spondylolisthesis, lumbar region 04/10/2019   HNP (herniated nucleus pulposus), lumbar 04/10/2019   Unstable angina ([facility]) 04/10/2018   Pre-operative laboratory examination 04/10/2018   Other fatigue 04/10/2018   Abnormal stress test 04/10/2018   Dyslipidemia 04/10/2018   Essential hypertension 04/10/2018   Past Medical History:  Diagnosis Date   Back pain    Hypertension    Family History  Problem Relation Age of Onset   Pancreatic cancer Mother    Liver cancer Father    Past Surgical History:  Procedure Laterality Date   APPENDECTOMY     LEFT HEART CATH AND CORONARY ANGIOGRAPHY N/A 04/13/2018   Procedure: LEFT HEART CATH AND CORONARY ANGIOGRAPHY;  Surgeon: Nelva Bush, MD;  Location: [facility];  Service: Cardiovascular;  Laterality: N/A;   Social History   Occupational History   Not on file  Tobacco Use   Smoking status: Never Smoker   Smokeless tobacco: Never Used  Substance and Sexual Activity   Alcohol use: Not on file   Drug use: Not on file   Sexual activity: Not on file

## 2019-05-31 NOTE — Procedures (Signed)
S1 Lumbosacral Transforaminal Epidural Steroid Injection - Sub-Pedicular Approach with Fluoroscopic Guidance   Patient: Vicki Hall      Date of Birth: 1956/01/25 MRN: [id number] PCP: Minette Brine, FNP      Visit Date: 05/31/2019   Universal Protocol:    Date/Time: 05/31/2011:46 PM  Consent Given By: the patient  Position:  PRONE  Additional Comments: Vital signs were monitored before and after the procedure. Patient was prepped and draped in the usual sterile fashion. The correct patient, procedure, and site was verified.   Injection Procedure Details:  Procedure Site One Meds Administered:  Meds ordered this encounter  Medications  . betamethasone acetate-betamethasone sodium phosphate (CELESTONE) injection 12 mg    Laterality: Left  Location/Site:  S1 Foramen   Needle size: 22 ga.  Needle type: Spinal  Needle Placement: Transforaminal  Findings:   -Comments: Excellent flow of contrast along the nerve and into the epidural space.  Epidurogram: Contrast epidurogram showed nerve root cut off and restricted flow pattern superior to the L5-S1 pedicle.  Procedure Details: After squaring off the sacral end-plate to get a true AP view, the C-arm was positioned so that the best possible view of the S1 foramen was visualized. The soft tissues overlying this structure were infiltrated with 2-3 ml. of 1% Lidocaine without Epinephrine.    The spinal needle was inserted toward the target using a "trajectory" view along the fluoroscope beam.  Under AP and lateral visualization, the needle was advanced so it did not puncture dura. Biplanar projections were used to confirm position. Aspiration was confirmed to be negative for CSF and/or blood. A 1-2 ml. volume of Isovue-250 was injected and flow of contrast was noted at each level. Radiographs were obtained for documentation purposes.   After attaining the desired flow of contrast documented above, a 0.5 to 1.0 ml test dose of  0.25% Marcaine was injected into each respective transforaminal space.  The patient was observed for 90 seconds post injection.  After no sensory deficits were reported, and normal lower extremity motor function was noted,   the above injectate was administered so that equal amounts of the injectate were placed at each foramen (level) into the transforaminal epidural space.   Additional Comments:  The patient tolerated the procedure well Dressing: Band-Aid with 2 x 2 sterile gauze    Post-procedure details: Patient was observed during the procedure. Post-procedure instructions were reviewed.  Patient left the clinic in stable condition.

## 2019-06-26 ENCOUNTER — Ambulatory Visit (INDEPENDENT_AMBULATORY_CARE_PROVIDER_SITE_OTHER): Payer: Medicaid Other | Admitting: Nurse Practitioner

## 2019-06-26 ENCOUNTER — Other Ambulatory Visit: Payer: Self-pay

## 2019-06-26 ENCOUNTER — Encounter: Payer: Self-pay | Admitting: Nurse Practitioner

## 2019-06-26 VITALS — BP 124/82 | HR 74 | Temp 98.4°F | Ht 64.0 in | Wt 178.0 lb

## 2019-06-26 DIAGNOSIS — I10 Essential (primary) hypertension: Secondary | ICD-10-CM | POA: Diagnosis not present

## 2019-06-26 DIAGNOSIS — G8929 Other chronic pain: Secondary | ICD-10-CM

## 2019-06-26 DIAGNOSIS — M545 Low back pain: Secondary | ICD-10-CM

## 2019-06-26 MED ORDER — NITROGLYCERIN ER 2.5 MG PO CPCR: 2.5000 mg | ORAL_CAPSULE | Freq: Every day | ORAL | 1 refills | Status: DC

## 2019-06-26 MED ORDER — ATORVASTATIN CALCIUM 10 MG PO TABS: 10.0000 mg | ORAL_TABLET | Freq: Every day | ORAL | 1 refills | Status: DC

## 2019-06-26 MED ORDER — BISOPROLOL-HYDROCHLOROTHIAZIDE 2.5-6.25 MG PO TABS: 2.0000 | ORAL_TABLET | Freq: Every day | ORAL | 0 refills | Status: DC

## 2019-06-26 NOTE — Patient Instructions (Signed)
   Once you get your Medicaid card changed call Dr. Lorie Apley office for an appt for her colonoscopy

## 2019-06-26 NOTE — Progress Notes (Addendum)
This visit occurred during the SARS-CoV-2 public health emergency.  Safety protocols were in place, including screening questions prior to the visit, additional usage of staff PPE, and extensive cleaning of exam room while observing appropriate contact time as indicated for disinfecting solutions.  Subjective:     Patient ID: Vicki Hall , female    DOB: May 15, 1956 , 63 y.o.   MRN: [id number]   Chief Complaint  Patient presents with  . Hypertension    patient presents today for a 3 month f/u     HPI  Hypertension This is a chronic problem. The current episode started more than 1 year ago. The problem is controlled. Pertinent negatives include no blurred vision or malaise/fatigue.     Past Medical History:  Diagnosis Date  . Back pain   . Hypertension      Family History  Problem Relation Age of Onset  . Pancreatic cancer Mother   . Liver cancer Father      Current Outpatient Medications:  .  atorvastatin (LIPITOR) 10 MG tablet, Take 1 tablet (10 mg total) by mouth daily., Disp: 90 tablet, Rfl: 1 .  bisoprolol-hydrochlorothiazide (ZIAC) 2.5-6.25 MG tablet, Take 2 tablets by mouth daily., Disp: 180 tablet, Rfl: 0 .  nitroGLYCERIN 2.5 MG CR capsule, Take 1 capsule (2.5 mg total) by mouth daily., Disp: 90 capsule, Rfl: 1   No Known Allergies   Review of Systems  Constitutional: Negative for malaise/fatigue.  Eyes: Negative for blurred vision.     Today's Vitals   06/26/19 0943  BP: 124/82  Pulse: 74  Temp: 98.4 F (36.9 C)  TempSrc: Oral  Weight: 178 lb (80.7 kg)  Height: 5\' 4"  (1.626 m)  PainSc: 4   PainLoc: Back   Body mass index is 30.55 kg/m.   Objective:  Physical Exam Constitutional:      General: She is not in acute distress.    Appearance: Normal appearance.  Cardiovascular:     Rate and Rhythm: Normal rate and regular rhythm.     Pulses: Normal pulses.     Heart sounds: Normal heart sounds. No murmur.  Pulmonary:     Effort: Pulmonary effort  is normal. No respiratory distress.     Breath sounds: Normal breath sounds.  Musculoskeletal:        General: Tenderness (low back, negative radicopathy) present. No deformity.     Right lower leg: No edema.     Left lower leg: No edema.  Skin:    Capillary Refill: Capillary refill takes less than 2 seconds.  Neurological:     General: No focal deficit present.     Mental Status: She is alert and oriented to person, place, and time.     Cranial Nerves: No cranial nerve deficit.  Psychiatric:        Mood and Affect: Mood normal.        Behavior: Behavior normal.        Thought Content: Thought content normal.        Judgment: Judgment normal.         Assessment And Plan:     1. Essential hypertension . B/P is well controlled.  . Continue current medications  2. Chronic midline low back pain without sciatica  She is being followed by orthopedic  She is restless with frequent changing of positions while sitting on table.    Minette Brine, FNP    THE PATIENT IS ENCOURAGED TO PRACTICE SOCIAL DISTANCING DUE TO THE COVID-19 PANDEMIC.

## 2019-07-07 ENCOUNTER — Encounter: Payer: Self-pay | Admitting: Nurse Practitioner

## 2019-08-04 DIAGNOSIS — Z20828 Contact with and (suspected) exposure to other viral communicable diseases: Secondary | ICD-10-CM | POA: Diagnosis not present

## 2019-08-04 DIAGNOSIS — Z03818 Encounter for observation for suspected exposure to other biological agents ruled out: Secondary | ICD-10-CM | POA: Diagnosis not present

## 2019-09-25 ENCOUNTER — Encounter: Payer: Medicaid Other | Admitting: Nurse Practitioner

## 2020-01-01 DIAGNOSIS — M109 Gout, unspecified: Secondary | ICD-10-CM | POA: Diagnosis not present

## 2020-01-08 ENCOUNTER — Ambulatory Visit (INDEPENDENT_AMBULATORY_CARE_PROVIDER_SITE_OTHER): Payer: Medicaid Other | Admitting: Orthopaedic Surgery

## 2020-01-08 ENCOUNTER — Encounter: Payer: Self-pay | Admitting: Orthopaedic Surgery

## 2020-01-08 ENCOUNTER — Other Ambulatory Visit: Payer: Self-pay

## 2020-01-08 DIAGNOSIS — M4316 Spondylolisthesis, lumbar region: Secondary | ICD-10-CM | POA: Diagnosis not present

## 2020-01-08 DIAGNOSIS — M5126 Other intervertebral disc displacement, lumbar region: Secondary | ICD-10-CM | POA: Diagnosis not present

## 2020-01-08 NOTE — Progress Notes (Signed)
Office Visit Note   Patient: Vicki Hall           Date of Birth: 07-07-1956           MRN: [id number] Visit Date: 01/08/2020              Requested by: Minette Brine, [address] [place],  [place] [address] PCP: Minette Brine, FNP   Assessment & Plan: Visit Diagnoses:  1. HNP (herniated nucleus pulposus), lumbar   2. Spondylolisthesis, lumbar region     Plan: Vicki Hall is accompanied by her daughter and here for follow-up evaluation of her chronic low back pain associated with left lower extremity radiculopathy.  She had an MRI scan when she was visiting in [place] last year demonstrating some areas of foraminal stenosis.  Dr. Ernestina Patches performed an epidural steroid injection at the end of last year which she notes "did not help".  She continues to have considerable back pain with left lower extremity radiculopathy.  She has a positive straight leg raise and decreased left ankle reflex.  I also think she has a weakness with foot dorsiflexion.  I suspect she has some irritation of the S1 nerve root.  She needs an MRI scan  Follow-Up Instructions: Return After MRI scan lumbar spine.   Orders:  Orders Placed This Encounter  Procedures  . MR Lumbar Spine w/o contrast   No orders of the defined types were placed in this encounter.     Procedures: No procedures performed   Clinical Data: No additional findings.   Subjective: Chief Complaint  Patient presents with  . Lower Back - Follow-up    Had injection w/ Dr. Ernestina Patches and she states that she don't think it has helped that much. States that the pain seems to be worsening  Vicki Hall is accompanied by her daughter and here for follow-up evaluation of her chronic low back pain.  I saw her last year with persistent back and left lower extremity radiculopathy.  She returned to her home in [place] last year and had an MRI scan results of which we outlined in her office note from September 2020.  Dr. Ernestina Patches evaluated her  in mid November of last year with an epidural steroid injection.  She is not sure it made much of a difference.  She continues to have back and left lower extremity pain.  She did return to [place] earlier in the year and contracted Covid.  Presently she has been vaccinated and recuperated without any problems but continues to have low back and left lower extremity radiculopathy.  She denies any bowel or bladder changes  HPI  Review of Systems   Objective: Vital Signs: There were no vitals taken for this visit.  Physical Exam Constitutional:      Appearance: She is well-developed.  Eyes:     Pupils: Pupils are equal, round, and reactive to light.  Pulmonary:     Effort: Pulmonary effort is normal.  Skin:    General: Skin is warm and dry.  Neurological:     Mental Status: She is alert and oriented to person, place, and time.  Psychiatric:        Behavior: Behavior normal.     Ortho Exam communicates very little in [place].  Daughter interprets for her.  Straight leg raise was positive on the left at about 70 degrees for back and left lower extremity pain.  Knee reflexes were symmetrical.  The left ankle reflex was absent.  I  thought she had a little bit of weakness with dorsiflexion and plantarflexion of her left foot.  Good pulses and normal sensibility.  Very minimal percussible tenderness of the lumbar spine.  No pain with range of motion of either hip  Specialty Comments:  No specialty comments available.  Imaging: No results found.   PMFS History: Patient Active Problem List   Diagnosis Date Noted  . Spondylolisthesis, lumbar region 04/10/2019  . HNP (herniated nucleus pulposus), lumbar 04/10/2019  . Unstable angina ([facility]) 04/10/2018  . Pre-operative laboratory examination 04/10/2018  . Other fatigue 04/10/2018  . Abnormal stress test 04/10/2018  . Dyslipidemia 04/10/2018  . Essential hypertension 04/10/2018   Past Medical History:  Diagnosis Date  . Back pain   .  Hypertension     Family History  Problem Relation Age of Onset  . Pancreatic cancer Mother   . Liver cancer Father     Past Surgical History:  Procedure Laterality Date  . APPENDECTOMY    . LEFT HEART CATH AND CORONARY ANGIOGRAPHY N/A 04/13/2018   Procedure: LEFT HEART CATH AND CORONARY ANGIOGRAPHY;  Surgeon: Nelva Bush, MD;  Location: [facility];  Service: Cardiovascular;  Laterality: N/A;   Social History   Occupational History  . Not on file  Tobacco Use  . Smoking status: Never Smoker  . Smokeless tobacco: Never Used  Substance and Sexual Activity  . Alcohol use: Not on file  . Drug use: Not on file  . Sexual activity: Not on file     Garald Balding, MD   Note - This record has been created using [employer].  Chart creation errors have been sought, but may not always  have been located. Such creation errors do not reflect on  the standard of medical care.

## 2020-01-22 ENCOUNTER — Other Ambulatory Visit: Payer: Self-pay

## 2020-01-23 NOTE — Progress Notes (Signed)
This visit occurred during the SARS-CoV-2 public health emergency.  Safety protocols were in place, including screening questions prior to the visit, additional usage of staff PPE, and extensive cleaning of exam room while observing appropriate contact time as indicated for disinfecting solutions.  Subjective:     Patient ID: Vicki Hall , female    DOB: [birth year] , 64 y.o.   MRN: [id number]   Chief Complaint  Patient presents with  . Hypertension  . Shoulder Pain    HPI  She is here today for her hypertension follow up.  Her daughter reports 4 weeks ago she went to urgent care because her left big toe was hurting and red was told it was a gout flare. She was not given any medications at that time. She also broke toe on her left foot in March she went to the orthopaedic and was a boot for 2 weeks. No meds given at urgent care. She complains of left shoulder pain since March when she had Covid.  Continues to have pain to her shoulder and unable to lift.    Hypertension This is a chronic problem. The current episode started more than 1 year ago. The problem has been gradually improving since onset. The problem is controlled. Risk factors for coronary artery disease include dyslipidemia and sedentary lifestyle. Past treatments include diuretics and beta blockers. There are no compliance problems.  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 There is no history of angina.  Shoulder Pain  The pain is present in the left shoulder. This is a new problem. Episode onset: 4 months. The problem occurs intermittently. The problem has been gradually worsening. The pain is at a severity of 10/10. The pain is severe. Associated symptoms include a limited range of motion and stiffness. Exacerbated by: lifting. She has tried NSAIDS for the symptoms. The treatment provided mild relief.     Past Medical History:  Diagnosis Date  . Back pain   . Hypertension   . Unstable angina ([facility])       Family History  Problem Relation Age of Onset  . Pancreatic cancer Mother   . Liver cancer Father      Current Outpatient Medications:  .  atorvastatin (LIPITOR) 10 MG tablet, Take 1 tablet (10 mg total) by mouth daily., Disp: 90 tablet, Rfl: 1 .  bisoprolol-hydrochlorothiazide (ZIAC) 2.5-6.25 MG tablet, Take 2 tablets by mouth daily., Disp: 180 tablet, Rfl: 0 .  nitroGLYCERIN 2.5 MG CR capsule, Take 1 capsule (2.5 mg total) by mouth daily., Disp: 90 capsule, Rfl: 1 .  diclofenac Sodium (VOLTAREN) 1 % GEL, Apply 2 g topically 4 (four) times daily., Disp: 100 g, Rfl: 2   No Known Allergies    Review of Systems  Constitutional: Negative.   Respiratory: Negative.   Cardiovascular: Negative.   Musculoskeletal: Positive for joint swelling (left shoulder pain anterior and posterior bursa ) and stiffness.       Joint stiffness and limited range of motion  Psychiatric/Behavioral: Negative.      Today's Vitals   01/24/20 1411  BP: 128/76  Pulse: 63  Temp: 98.4 F (36.9 C)  TempSrc: Oral  Weight: 165 lb (74.8 kg)  Height: 5' 3.2" (1.605 m)  PainSc: 10-Worst pain ever  PainLoc: Shoulder   Body mass index is 29.04 kg/m.   Objective:  Physical Exam Constitutional:      General: She is not in acute distress.    Appearance: Normal appearance. She is normal weight.  Cardiovascular:  Rate and Rhythm: Normal rate and regular rhythm.     Pulses: Normal pulses.     Heart sounds: Normal heart sounds.  Pulmonary:     Effort: Pulmonary effort is normal.     Breath sounds: Normal breath sounds.  Musculoskeletal:     Right lower leg: No edema.     Left lower leg: No edema.  Skin:    General: Skin is warm and dry.     Capillary Refill: Capillary refill takes less than 2 seconds.  Neurological:     General: No focal deficit present.     Mental Status: She is alert and oriented to person, place, and time.     Cranial Nerves: No cranial nerve deficit.  Psychiatric:         Mood and Affect: Mood normal.        Behavior: Behavior normal.        Thought Content: Thought content normal.        Judgment: Judgment normal.         Assessment And Plan:     Problem List Items Addressed This Visit      Cardiovascular and Mediastinum   Unstable angina ([facility])    Chronic, being followed by Cardiologist Takes nitro CR daily as scheduled med       Essential hypertension - Primary    . B/P is well controlled.  . CMP ordered to check renal function.  . The importance of regular exercise as tolerated and dietary modification was stressed to the patient.        Relevant Orders   CMP14+EGFR     Other   Elevated cholesterol     Chronic  She is taking atorvastatin daily   Will check lipid panel      Relevant Orders   Lipid panel    Other Visit Diagnoses    Left shoulder pain, unspecified chronicity       pain and stiffness to left shoulder, anterior and posterior pain to bursa space will treat with Toradol 60 mg IM    Relevant Medications   ketorolac (TORADOL) injection 60 mg (Completed)   diclofenac Sodium (VOLTAREN) 1 % GEL   Pain of toe of left foot     No pain currently will check uric acid level   Relevant Orders   Uric acid      Screening mammogram, encounter for      Pt instructed on Self Breast Exam.According to ACOG guidelines Women aged 39 and older are recommended to get an annual mammogram. Form completed and given to patient contact the [facility] for appointment scheduing.   Pt encouraged to get annual mammogram   Relevant Orders   MM DIGITAL SCREENING BILATERAL   Encounter for screening colonoscopy      According to USPTF Colorectal cancer Screening guidelines. Colonoscopy is recommended every 10 years, starting at age 80years.  Will refer to GI for colon cancer screening.   Relevant Orders   Ambulatory referral to Gastroenterology        Patient was given opportunity to ask questions. Patient verbalized  understanding of the plan and was able to repeat key elements of the plan. All questions were answered to their satisfaction.  Minette Brine, FNP   I, Minette Brine, FNP, have reviewed all documentation for this visit. The documentation on 01/24/20 for the exam, diagnosis, procedures, and orders are all accurate and complete.  THE PATIENT IS ENCOURAGED TO PRACTICE SOCIAL DISTANCING DUE TO THE COVID-19  PANDEMIC.

## 2020-01-24 ENCOUNTER — Other Ambulatory Visit: Payer: Self-pay

## 2020-01-24 ENCOUNTER — Ambulatory Visit: Payer: Medicaid Other | Admitting: Nurse Practitioner

## 2020-01-24 ENCOUNTER — Encounter: Payer: Self-pay | Admitting: Nurse Practitioner

## 2020-01-24 VITALS — BP 128/76 | HR 63 | Temp 98.4°F | Ht 63.2 in | Wt 165.0 lb

## 2020-01-24 DIAGNOSIS — M79675 Pain in left toe(s): Secondary | ICD-10-CM | POA: Diagnosis not present

## 2020-01-24 DIAGNOSIS — M25512 Pain in left shoulder: Secondary | ICD-10-CM | POA: Diagnosis not present

## 2020-01-24 DIAGNOSIS — I200 Unstable angina: Secondary | ICD-10-CM | POA: Diagnosis not present

## 2020-01-24 DIAGNOSIS — E7800 Pure hypercholesterolemia, unspecified: Secondary | ICD-10-CM | POA: Diagnosis not present

## 2020-01-24 DIAGNOSIS — Z1231 Encounter for screening mammogram for malignant neoplasm of breast: Secondary | ICD-10-CM | POA: Diagnosis not present

## 2020-01-24 DIAGNOSIS — I10 Essential (primary) hypertension: Secondary | ICD-10-CM

## 2020-01-24 DIAGNOSIS — Z1211 Encounter for screening for malignant neoplasm of colon: Secondary | ICD-10-CM

## 2020-01-24 MED ORDER — KETOROLAC TROMETHAMINE 60 MG/2ML IM SOLN
60.0000 mg | Freq: Once | INTRAMUSCULAR | Status: AC
  Administered 2020-01-24: 60 mg via INTRAMUSCULAR

## 2020-01-24 MED ORDER — DICLOFENAC SODIUM 1 % EX GEL: 2.0000 g | Freq: Four times a day (QID) | CUTANEOUS | 2 refills | Status: AC

## 2020-01-24 NOTE — Assessment & Plan Note (Signed)
Chronic, being followed by Cardiologist Takes nitro CR daily as scheduled med

## 2020-01-24 NOTE — Patient Instructions (Signed)
COVID-19 Vaccine Information can be found at: [email or web address] For questions related to vaccine distribution or appointments, please email [email or web address] or call [phone]    

## 2020-01-24 NOTE — Assessment & Plan Note (Addendum)
.   B/P is well controlled.  . CMP ordered to check renal function.  . The importance of regular exercise as tolerated and dietary modification was stressed to the patient.

## 2020-01-24 NOTE — Assessment & Plan Note (Signed)
   Chronic  She is taking atorvastatin daily   Will check lipid panel

## 2020-01-25 ENCOUNTER — Other Ambulatory Visit: Payer: Self-pay | Admitting: Nurse Practitioner

## 2020-01-25 ENCOUNTER — Encounter: Payer: Self-pay | Admitting: Gastroenterology

## 2020-01-25 DIAGNOSIS — I10 Essential (primary) hypertension: Secondary | ICD-10-CM

## 2020-01-25 LAB — CMP14+EGFR
ALT: 14 IU/L (ref 0–32)
AST: 20 IU/L (ref 0–40)
Albumin/Globulin Ratio: 1.7 (ref 1.2–2.2)
Albumin: 4.2 g/dL (ref 3.8–4.8)
Alkaline Phosphatase: 61 IU/L (ref 48–121)
BUN/Creatinine Ratio: 17 (ref 12–28)
BUN: 14 mg/dL (ref 8–27)
Bilirubin Total: <0.2 mg/dL (ref 0.0–1.2)
CO2: 24 mmol/L (ref 20–29)
Calcium: 9.8 mg/dL (ref 8.7–10.3)
Chloride: 103 mmol/L (ref 96–106)
Creatinine, Ser: 0.81 mg/dL (ref 0.57–1.00)
GFR calc Af Amer: 89 mL/min/{1.73_m2} (ref >59)
GFR calc non Af Amer: 78 mL/min/{1.73_m2} (ref >59)
Globulin, Total: 2.5 g/dL (ref 1.5–4.5)
Glucose: 90 mg/dL (ref 65–99)
Potassium: 3.5 mmol/L (ref 3.5–5.2)
Sodium: 140 mmol/L (ref 134–144)
Total Protein: 6.7 g/dL (ref 6.0–8.5)

## 2020-01-25 LAB — LIPID PANEL
Chol/HDL Ratio: 4 ratio (ref 0.0–4.4)
Cholesterol, Total: 280 mg/dL — ABNORMAL HIGH (ref 100–199)
HDL: 70 mg/dL (ref >39)
LDL Chol Calc (NIH): 199 mg/dL — ABNORMAL HIGH (ref 0–99)
Triglycerides: 71 mg/dL (ref 0–149)
VLDL Cholesterol Cal: 11 mg/dL (ref 5–40)

## 2020-01-25 LAB — URIC ACID: Uric Acid: 6.5 mg/dL (ref 3.0–7.2)

## 2020-01-30 ENCOUNTER — Other Ambulatory Visit: Payer: Self-pay

## 2020-01-30 DIAGNOSIS — I10 Essential (primary) hypertension: Secondary | ICD-10-CM

## 2020-01-30 MED ORDER — NITROGLYCERIN ER 2.5 MG PO CPCR: 2.5000 mg | ORAL_CAPSULE | Freq: Every day | ORAL | 1 refills | Status: DC

## 2020-02-05 ENCOUNTER — Ambulatory Visit: Payer: Medicaid Other | Admitting: Orthopaedic Surgery

## 2020-02-12 ENCOUNTER — Ambulatory Visit
Admission: RE | Admit: 2020-02-12 | Discharge: 2020-02-12 | Disposition: A | Discharge status: Home / Self Care | Payer: Medicaid Other | Source: Ambulatory Visit | Attending: Orthopaedic Surgery | Admitting: Orthopaedic Surgery

## 2020-02-12 ENCOUNTER — Other Ambulatory Visit: Payer: Self-pay

## 2020-02-12 ENCOUNTER — Ambulatory Visit
Admission: RE | Admit: 2020-02-12 | Discharge: 2020-02-12 | Disposition: A | Discharge status: Home / Self Care | Payer: Medicaid Other | Source: Ambulatory Visit | Attending: Nurse Practitioner | Admitting: Nurse Practitioner

## 2020-02-12 DIAGNOSIS — M5126 Other intervertebral disc displacement, lumbar region: Secondary | ICD-10-CM

## 2020-02-12 DIAGNOSIS — Z1231 Encounter for screening mammogram for malignant neoplasm of breast: Secondary | ICD-10-CM | POA: Diagnosis not present

## 2020-02-12 DIAGNOSIS — M48061 Spinal stenosis, lumbar region without neurogenic claudication: Secondary | ICD-10-CM | POA: Diagnosis not present

## 2020-02-14 ENCOUNTER — Other Ambulatory Visit: Payer: Self-pay | Admitting: Nurse Practitioner

## 2020-02-14 ENCOUNTER — Other Ambulatory Visit: Payer: Self-pay

## 2020-02-14 ENCOUNTER — Ambulatory Visit (INDEPENDENT_AMBULATORY_CARE_PROVIDER_SITE_OTHER): Payer: Medicaid Other | Admitting: Orthopaedic Surgery

## 2020-02-14 ENCOUNTER — Encounter: Payer: Self-pay | Admitting: Orthopaedic Surgery

## 2020-02-14 VITALS — Ht 63.0 in | Wt 165.0 lb

## 2020-02-14 DIAGNOSIS — M48061 Spinal stenosis, lumbar region without neurogenic claudication: Secondary | ICD-10-CM

## 2020-02-14 DIAGNOSIS — I10 Essential (primary) hypertension: Secondary | ICD-10-CM

## 2020-02-14 DIAGNOSIS — M4310 Spondylolisthesis, site unspecified: Secondary | ICD-10-CM | POA: Insufficient documentation

## 2020-02-14 MED ORDER — ATORVASTATIN CALCIUM 20 MG PO TABS: 20.0000 mg | ORAL_TABLET | Freq: Every day | ORAL | 1 refills | Status: DC

## 2020-02-14 NOTE — Progress Notes (Signed)
Office Visit Note   Patient: Vicki Hall           Date of Birth: [birth year]           MRN: [id number] Visit Date: 02/14/2020              Requested by: Minette Brine, [address] [place],  [place] [address] PCP: Minette Brine, FNP   Assessment & Plan: Visit Diagnoses:  1. Anterolisthesis   2. Spinal stenosis of lumbar region, unspecified whether neurogenic claudication present   3. Retrolisthesis of vertebrae     Plan:  #1: At this time I have discussed with Dr. [name] the findings of her MRI scan and he has graciously will see her in follow-up for possible surgical evaluation.  Follow-Up Instructions: No follow-ups on file.   Face-to-face time spent with patient was greater than 30 minutes.  Greater than 50% of the time was spent in counseling and coordination of care.   Orders:  No orders of the defined types were placed in this encounter.  No orders of the defined types were placed in this encounter.     Procedures: No procedures performed   Clinical Data: No additional findings.   Subjective: Chief Complaint  Patient presents with  . Lower Back - Follow-up    MRI review   HPI Patient presents today for follow up on her lower back pain. She had an MRI on 02/12/2020. She is here today for those results. No changes since her last visit, except maybe a little worse since her last visit. She takes Ibuprofen for pain.    Review of Systems  Constitutional: Negative for fatigue.  HENT: Negative for ear pain.   Eyes: Negative for pain.  Respiratory: Negative for shortness of breath.   Cardiovascular: Negative for leg swelling.  Gastrointestinal: Negative for constipation and diarrhea.  Endocrine: Negative for cold intolerance and heat intolerance.  Genitourinary: Negative for difficulty urinating.  Musculoskeletal: Negative for joint swelling.  Allergic/Immunologic: Negative for food allergies.  Neurological: Negative for weakness.    Hematological: Does not bruise/bleed easily.  Psychiatric/Behavioral: Positive for sleep disturbance.     Objective: Vital Signs: Ht 5\' 3"  (1.6 m)   Wt 165 lb (74.8 kg)   BMI 29.23 kg/m   Physical Exam Constitutional:      Appearance: Normal appearance. She is well-developed.  HENT:     Head: Normocephalic.  Eyes:     Pupils: Pupils are equal, round, and reactive to light.  Pulmonary:     Effort: Pulmonary effort is normal.  Skin:    General: Skin is warm and dry.  Neurological:     Mental Status: She is alert and oriented to person, place, and time.  Psychiatric:        Behavior: Behavior normal.     Ortho Exam  Specialty Comments:  No specialty comments available.  Imaging: MR Lumbar Spine w/o contrast  Result Date: 02/12/2020 CLINICAL DATA:  Herniated nucleus pulposis, lumbar. Spondylolisthesis. Additional history provided: Patient reports chronic low back pain with pain that goes down left side and left leg for 10 years, weakness with numbness/tingling in left leg. EXAM: MRI LUMBAR SPINE WITHOUT CONTRAST TECHNIQUE: Multiplanar, multisequence MR imaging of the lumbar spine was performed. No intravenous contrast was administered. COMPARISON:  Lumbar spine radiographs 06/23/2018. FINDINGS: Segmentation: 5 lumbar vertebrae in correlating with prior lumbar spine radiographs. Alignment: Lumbar dextrocurvature. Mild T12-L1, L1-L2, L2-L3 and L3-L4 grade 1 retrolisthesis. 7 mm L4-L5 grade 1 anterolisthesis,  unchanged. 3 mm L5-S1 grade 1 anterolisthesis, unchanged. Vertebrae: Vertebral body height is maintained. No definite pars interarticularis defect is identified. There is mild degenerative endplate edema at G6-Y6. No suspicious osseous lesion. Conus medullaris and cauda equina: Conus extends to the L1-L2 level. No signal abnormality within the visualized distal spinal cord. Paraspinal and other soft tissues: No abnormality identified within included portions of the  abdomen/retroperitoneum. Atrophy of the lumbar paraspinal musculature. Disc levels: Multilevel disc degeneration. Most notably, there is moderate/severe disc degeneration at L4-L5 and moderate disc degeneration at the T11-T12, T12-L1 and L5-S1 levels. T12-L1: Grade 1 retrolisthesis disc bulge. Disc uncovering with minimal bulge. Mild relative spinal canal narrowing. No significant foraminal stenosis. L1-L2: Grade 1 retrolisthesis. Disc uncovering with minimal disc bulge. No significant spinal canal or foraminal stenosis. L2-L3: Grade 1 retrolisthesis. Disc uncovering with disc bulge. Bilateral broad-based subarticular/foraminal disc protrusions. Mild facet arthrosis/ligamentum flavum hypertrophy. Mild bilateral subarticular narrowing (greater on the left). Mild relative narrowing of the central canal. Mild bilateral neural foraminal narrowing (greater on the left) L3-L4: Grade 1 retrolisthesis. Disc bulge. Mild facet arthrosis/ligamentum flavum hypertrophy. Prominence of the dorsal epidural fat. Moderate central canal stenosis with mild bilateral subarticular narrowing. Mild bilateral neural foraminal narrowing (greater on the right). L4-L5: 7 mm grade 1 anterolisthesis. Disc uncovering with disc bulge. Advanced facet arthrosis and ligamentum flavum hypertrophy. Severe spinal canal stenosis with near complete effacement of the thecal sac. Bilateral neural foraminal narrowing (moderate/severe right it, moderate left). L5-S1: Grade 1 anterolisthesis. Posterior annular fissure. Disc uncovering with slight disc bulge. Moderate facet arthrosis with ligamentum flavum hypertrophy. Mild bilateral subarticular narrowing without frank nerve root impingement. Central canal patent. Mild right neural foraminal narrowing. IMPRESSION: Lumbar spondylosis as outlined and most notably as follows. At L4-L5, there is 7 mm grade 1 anterolisthesis. Disc uncovering with disc bulge. Advanced facet arthrosis and ligamentum flavum  hypertrophy. Severe spinal canal stenosis with near complete effacement of the thecal sac. Bilateral neural foraminal narrowing (moderate/severe right, moderate left). At L3-L4, there is multifactorial moderate central canal stenosis with mild bilateral subarticular narrowing. Mild bilateral neural foraminal narrowing. No more than mild spinal canal or neural foraminal narrowing at the remaining levels. Grade 1 spondylolisthesis at additional levels as detailed. Electronically Signed   By: Kellie Simmering DO   On: 02/12/2020 18:37      PMFS History: Current Outpatient Medications  Medication Sig Dispense Refill  . atorvastatin (LIPITOR) 10 MG tablet TAKE 1 TABLET BY MOUTH EVERY DAY 90 tablet 1  . bisoprolol-hydrochlorothiazide (ZIAC) 2.5-6.25 MG tablet TAKE 2 TABLETS BY MOUTH EVERY DAY 180 tablet 0  . diclofenac Sodium (VOLTAREN) 1 % GEL Apply 2 g topically 4 (four) times daily. 100 g 2  . nitroGLYCERIN 2.5 MG CR capsule Take 1 capsule (2.5 mg total) by mouth daily. 90 capsule 1   No current facility-administered medications for this visit.    Patient Active Problem List   Diagnosis Date Noted  . Spinal stenosis of lumbar region 02/14/2020  . Retrolisthesis of vertebrae 02/14/2020  . Elevated cholesterol 01/24/2020  . Anterolisthesis 04/10/2019  . HNP (herniated nucleus pulposus), lumbar 04/10/2019  . Unstable angina ([place]) 04/10/2018  . Pre-operative laboratory examination 04/10/2018  . Other fatigue 04/10/2018  . Abnormal stress test 04/10/2018  . Dyslipidemia 04/10/2018  . Essential hypertension 04/10/2018   Past Medical History:  Diagnosis Date  . Back pain   . Hypertension   . Unstable angina ([facility])     Family History  Problem Relation Age  of Onset  . Pancreatic cancer Mother   . Liver cancer Father     Past Surgical History:  Procedure Laterality Date  . APPENDECTOMY    . LEFT HEART CATH AND CORONARY ANGIOGRAPHY N/A 04/13/2018   Procedure: LEFT HEART CATH AND CORONARY  ANGIOGRAPHY;  Surgeon: Nelva Bush, MD;  Location: [facility];  Service: Cardiovascular;  Laterality: N/A;   Social History   Occupational History  . Not on file  Tobacco Use  . Smoking status: Never Smoker  . Smokeless tobacco: Never Used  Substance and Sexual Activity  . Alcohol use: Not on file  . Drug use: Not on file  . Sexual activity: Not on file

## 2020-02-20 ENCOUNTER — Other Ambulatory Visit: Payer: Self-pay

## 2020-02-20 DIAGNOSIS — I10 Essential (primary) hypertension: Secondary | ICD-10-CM

## 2020-02-20 MED ORDER — BISOPROLOL-HYDROCHLOROTHIAZIDE 2.5-6.25 MG PO TABS: 2.0000 | ORAL_TABLET | Freq: Every day | ORAL | 0 refills | Status: AC

## 2020-03-05 ENCOUNTER — Telehealth: Payer: Self-pay | Admitting: *Deleted

## 2020-03-05 NOTE — Telephone Encounter (Signed)
John,  This pt has a history of unstable angina- she takes Nitro CR daily- last cardio note I can find is in 2019- 04-13-2018 she had a cardiac cath with no interventions, she had an Echo 04-18-2018, EF 60-65 % and no AVS-  Looks like her PCP refills her Nitro CR as I have no other cardio notes- she has no GI hx- Do you think she needs OV or is she ok to proceed as scheduled?  Thanks, Hetty Ely RN

## 2020-03-06 NOTE — Telephone Encounter (Signed)
Elizabeth,  This pt is cleared for anesthetic care at [facility].   Thanks,  Faith Branan 

## 2020-03-10 ENCOUNTER — Other Ambulatory Visit: Payer: Self-pay

## 2020-03-10 ENCOUNTER — Telehealth: Payer: Self-pay

## 2020-03-10 DIAGNOSIS — I10 Essential (primary) hypertension: Secondary | ICD-10-CM

## 2020-03-10 DIAGNOSIS — E7800 Pure hypercholesterolemia, unspecified: Secondary | ICD-10-CM

## 2020-03-10 MED ORDER — ATORVASTATIN CALCIUM 20 MG PO TABS: 20.0000 mg | ORAL_TABLET | Freq: Every day | ORAL | 1 refills | Status: AC

## 2020-03-10 NOTE — Telephone Encounter (Signed)
The pt's daughter left a message that she has been calling for 3 weeks leaving a voicemail each time about her needing someone to call her back about her it sounded like she said post levels because her mother is having dizzy spells. She said that she is disappointed that no one has called her back. I called and left the pt's daughter a message to call the office.

## 2020-03-19 ENCOUNTER — Ambulatory Visit (AMBULATORY_SURGERY_CENTER): Payer: Self-pay | Admitting: *Deleted

## 2020-03-19 ENCOUNTER — Other Ambulatory Visit: Payer: Self-pay

## 2020-03-19 VITALS — Ht 63.0 in | Wt 165.0 lb

## 2020-03-19 DIAGNOSIS — Z01818 Encounter for other preprocedural examination: Secondary | ICD-10-CM

## 2020-03-19 DIAGNOSIS — Z1211 Encounter for screening for malignant neoplasm of colon: Secondary | ICD-10-CM

## 2020-03-19 MED ORDER — SUPREP BOWEL PREP KIT 17.5-3.13-1.6 GM/177ML PO SOLN: 1.0000 | Freq: Once | ORAL | 0 refills | Status: AC

## 2020-03-19 NOTE — Progress Notes (Signed)
Daughter London Sheer in [place] to translate for pt - pt speaks Arabic , no English   cov test [place] 9-20   No egg or soy allergy known to patient  No issues with past sedation with any surgeries or procedures no intubation problems in the past  No FH of Malignant Hyperthermia No diet pills per patient No home 02 use per patient  No blood thinners per patient  Pt states  issues with constipation - hard stools every 4-5 days even with stool softener - will do a 2 day suprep  No A fib or A flutter  EMMI video to pt or via [place] 19 guidelines implemented in [facility] today with Pt and RN    Due to the COVID-19 pandemic we are asking patients to follow these guidelines. Please only bring one care partner. Please be aware that your care partner may wait in the car in the parking lot or if they feel like they will be too hot to wait in the car, they may wait in the lobby on the 4th floor. All care partners are required to wear a mask the entire time (we do not have any that we can provide them), they need to practice social distancing, and we will do a Covid check for all patient's and care partners when you arrive. Also we will check their temperature and your temperature. If the care partner waits in their car they need to stay in the parking lot the entire time and we will call them on their cell phone when the patient is ready for discharge so they can bring the car to the front of the building. Also all patient's will need to wear a mask into building.

## 2020-03-24 ENCOUNTER — Telehealth: Payer: Self-pay | Admitting: Nurse Practitioner

## 2020-03-24 NOTE — Telephone Encounter (Signed)
Called to inquire about the patient's dizziness which has subsided over the last week.  Explained the different causes of dizziness which can include cardiac issues.  She is aware to dial ext [phone] when calling the office to speak with me or [place].  Answered all questions.

## 2020-03-31 ENCOUNTER — Other Ambulatory Visit
Admission: RE | Admit: 2020-03-31 | Discharge: 2020-03-31 | Disposition: A | Discharge status: Home / Self Care | Payer: Medicaid Other | Source: Ambulatory Visit | Attending: Gastroenterology | Admitting: Gastroenterology

## 2020-03-31 ENCOUNTER — Other Ambulatory Visit: Payer: Self-pay

## 2020-03-31 DIAGNOSIS — Z01812 Encounter for preprocedural laboratory examination: Secondary | ICD-10-CM | POA: Insufficient documentation

## 2020-03-31 DIAGNOSIS — Z20822 Contact with and (suspected) exposure to covid-19: Secondary | ICD-10-CM | POA: Insufficient documentation

## 2020-03-31 LAB — SARS CORONAVIRUS 2 (TAT 6-24 HRS): SARS Coronavirus 2: NEGATIVE

## 2020-04-02 ENCOUNTER — Other Ambulatory Visit: Payer: Self-pay

## 2020-04-02 ENCOUNTER — Ambulatory Visit (AMBULATORY_SURGERY_CENTER): Payer: Medicaid Other | Admitting: Gastroenterology

## 2020-04-02 ENCOUNTER — Encounter: Payer: Self-pay | Admitting: Gastroenterology

## 2020-04-02 VITALS — BP 119/65 | HR 54 | Temp 97.1°F | Resp 13 | Ht 63.0 in | Wt 165.0 lb

## 2020-04-02 DIAGNOSIS — D123 Benign neoplasm of transverse colon: Secondary | ICD-10-CM | POA: Diagnosis not present

## 2020-04-02 DIAGNOSIS — D125 Benign neoplasm of sigmoid colon: Secondary | ICD-10-CM | POA: Diagnosis not present

## 2020-04-02 DIAGNOSIS — D120 Benign neoplasm of cecum: Secondary | ICD-10-CM | POA: Diagnosis not present

## 2020-04-02 DIAGNOSIS — E669 Obesity, unspecified: Secondary | ICD-10-CM | POA: Diagnosis not present

## 2020-04-02 DIAGNOSIS — Z1211 Encounter for screening for malignant neoplasm of colon: Secondary | ICD-10-CM

## 2020-04-02 DIAGNOSIS — E785 Hyperlipidemia, unspecified: Secondary | ICD-10-CM | POA: Diagnosis not present

## 2020-04-02 DIAGNOSIS — D128 Benign neoplasm of rectum: Secondary | ICD-10-CM | POA: Diagnosis not present

## 2020-04-02 DIAGNOSIS — D127 Benign neoplasm of rectosigmoid junction: Secondary | ICD-10-CM | POA: Diagnosis not present

## 2020-04-02 DIAGNOSIS — D122 Benign neoplasm of ascending colon: Secondary | ICD-10-CM | POA: Diagnosis not present

## 2020-04-02 MED ORDER — SODIUM CHLORIDE 0.9 % IV SOLN: 500.0000 mL | Freq: Once | INTRAVENOUS | Status: DC

## 2020-04-02 NOTE — Patient Instructions (Signed)
YOU HAD AN ENDOSCOPIC PROCEDURE TODAY AT THE  ENDOSCOPY CENTER:   Refer to the procedure report that was given to you for any specific questions about what was found during the examination.  If the procedure report does not answer your questions, please call your gastroenterologist to clarify.  If you requested that your care partner not be given the details of your procedure findings, then the procedure report has been included in a sealed envelope for you to review at your convenience later.  YOU SHOULD EXPECT: Some feelings of bloating in the abdomen. Passage of more gas than usual.  Walking can help get rid of the air that was put into your GI tract during the procedure and reduce the bloating. If you had a lower endoscopy (such as a colonoscopy or flexible sigmoidoscopy) you may notice spotting of blood in your stool or on the toilet paper. If you underwent a bowel prep for your procedure, you may not have a normal bowel movement for a few days.  **Handout given on polyps**  Please Note:  You might notice some irritation and congestion in your nose or some drainage.  This is from the oxygen used during your procedure.  There is no need for concern and it should clear up in a day or so.  SYMPTOMS TO REPORT IMMEDIATELY:   Following lower endoscopy (colonoscopy or flexible sigmoidoscopy):  Excessive amounts of blood in the stool  Significant tenderness or worsening of abdominal pains  Swelling of the abdomen that is new, acute  Fever of 100F or higher    For urgent or emergent issues, a gastroenterologist can be reached at any hour by calling [phone]. Do not use MyChart messaging for urgent concerns.    DIET:  We do recommend a small meal at first, but then you may proceed to your regular diet.  Drink plenty of fluids but you should avoid alcoholic beverages for 24 hours.  ACTIVITY:  You should plan to take it easy for the rest of today and you should NOT DRIVE or use heavy  machinery until tomorrow (because of the sedation medicines used during the test).    FOLLOW UP: Our staff will call the number listed on your records 48-72 hours following your procedure to check on you and address any questions or concerns that you may have regarding the information given to you following your procedure. If we do not reach you, we will leave a message.  We will attempt to reach you two times.  During this call, we will ask if you have developed any symptoms of COVID 19. If you develop any symptoms (ie: fever, flu-like symptoms, shortness of breath, cough etc.) before then, please call [phone].  If you test positive for Covid 19 in the 2 weeks post procedure, please call and report this information to us.    If any biopsies were taken you will be contacted by phone or by letter within the next 1-3 weeks.  Please call us at [phone] if you have not heard about the biopsies in 3 weeks.    SIGNATURES/CONFIDENTIALITY: You and/or your care partner have signed paperwork which will be entered into your electronic medical record.  These signatures attest to the fact that that the information above on your After Visit Summary has been reviewed and is understood.  Full responsibility of the confidentiality of this discharge information lies with you and/or your care-partner. 

## 2020-04-02 NOTE — Progress Notes (Signed)
Called to room to assist during endoscopic procedure.  Patient ID and intended procedure confirmed with present staff. Received instructions for my participation in the procedure from the performing physician.  

## 2020-04-02 NOTE — Progress Notes (Signed)
Report to PACU, RN, vss, BBS= Clear.  

## 2020-04-02 NOTE — Op Note (Signed)
[facility] Patient Name: Vicki Hall Procedure Date: 04/02/2020 1:48 PM MRN: [id number] Endoscopist: Remo Lipps P. Havery Moros , MD Age: 64 Referring MD:  Date of Birth: [birth year] Gender: Female Account #: [id number] Procedure:                Colonoscopy Indications:              Screening for colorectal malignant neoplasm Medicines:                Monitored Anesthesia Care Procedure:                Pre-Anesthesia Assessment:                           - Prior to the procedure, a History and Physical                            was performed, and patient medications and                            allergies were reviewed. The patient's tolerance of                            previous anesthesia was also reviewed. The risks                            and benefits of the procedure and the sedation                            options and risks were discussed with the patient.                            All questions were answered, and informed consent                            was obtained. Prior Anticoagulants: The patient has                            taken no previous anticoagulant or antiplatelet                            agents. ASA Grade Assessment: III - A patient with                            severe systemic disease. After reviewing the risks                            and benefits, the patient was deemed in                            satisfactory condition to undergo the procedure.                           After obtaining informed consent, the colonoscope  was passed under direct vision. Throughout the                            procedure, the patient's blood pressure, pulse, and                            oxygen saturations were monitored continuously. The                            Colonoscope was introduced through the anus and                            advanced to the the cecum, identified by                            appendiceal  orifice and ileocecal valve. The                            colonoscopy was performed without difficulty. The                            patient tolerated the procedure well. The quality                            of the bowel preparation was good. The ileocecal                            valve, appendiceal orifice, and rectum were                            photographed. Scope In: 1:53:18 PM Scope Out: 2:15:26 PM Scope Withdrawal Time: 0 hours 19 minutes 45 seconds  Total Procedure Duration: 0 hours 22 minutes 8 seconds  Findings:                 The perianal and digital rectal examinations were                            normal.                           A 3 mm polyp was found in the cecum. The polyp was                            sessile. The polyp was removed with a cold snare.                            Resection and retrieval were complete.                           A 3 mm polyp was found in the ascending colon. The                            polyp was sessile. The polyp was removed with a  cold snare. Resection and retrieval were complete.                           A 3 mm polyp was found in the transverse colon. The                            polyp was sessile. The polyp was removed with a                            cold snare. Resection and retrieval were complete.                           A 2 mm polyp was found in the sigmoid colon. The                            polyp was sessile. The polyp was removed with a                            cold snare. Resection and retrieval were complete.                           A 3 mm polyp was found in the rectum. The polyp was                            sessile. The polyp was removed with a cold snare.                            Resection and retrieval were complete.                           The exam was otherwise without abnormality. Complications:            No immediate complications. Estimated blood loss:                             Minimal. Estimated Blood Loss:     Estimated blood loss was minimal. Impression:               - One 3 mm polyp in the cecum, removed with a cold                            snare. Resected and retrieved.                           - One 3 mm polyp in the ascending colon, removed                            with a cold snare. Resected and retrieved.                           - One 3 mm polyp in the transverse colon, removed  with a cold snare. Resected and retrieved.                           - One 2 mm polyp in the sigmoid colon, removed with                            a cold snare. Resected and retrieved.                           - One 3 mm polyp in the rectum, removed with a cold                            snare. Resected and retrieved.                           - The examination was otherwise normal. Recommendation:           - Patient has a contact number available for                            emergencies. The signs and symptoms of potential                            delayed complications were discussed with the                            patient. Return to normal activities tomorrow.                            Written discharge instructions were provided to the                            patient.                           - Resume previous diet.                           - Continue present medications.                           - Await pathology results. Remo Lipps P. Kimmy Parish, MD 04/02/2020 2:19:57 PM This report has been signed electronically.

## 2020-04-02 NOTE — Progress Notes (Signed)
Pt's states no medical or surgical changes since previsit or office visit. 

## 2020-04-04 ENCOUNTER — Telehealth: Payer: Self-pay | Admitting: *Deleted

## 2020-04-04 ENCOUNTER — Telehealth: Payer: Self-pay

## 2020-04-04 NOTE — Telephone Encounter (Signed)
Left message on answering machine. 

## 2020-04-04 NOTE — Telephone Encounter (Signed)
No answer for post procedure call back and unable to leave message. 

## 2020-04-08 DIAGNOSIS — R072 Precordial pain: Secondary | ICD-10-CM | POA: Insufficient documentation

## 2020-04-08 DIAGNOSIS — Z7189 Other specified counseling: Secondary | ICD-10-CM | POA: Insufficient documentation

## 2020-04-08 NOTE — Progress Notes (Signed)
Cardiology Office Note   Date:  04/10/2020   ID:  Vicki Hall, DOB [birth year], MRN [id number]  PCP:  Minette Brine, FNP  Cardiologist:   No primary care provider on file.   Chief Complaint  Patient presents with  . Dizziness      History of Present Illness: Vicki Hall is a 64 y.o. female who presents for evaluation  of chest pain and abnormal stress test.  She is from [place] and this history was obtained through an interpreter speaking Arabic.   I saw her in 2019 for chest pain.  Cath demonstrated no CAD.    Since then she has had some atypical chest pain that has been sporadic.  More recently she has been having dizziness.  This is happening 2-3 times per day.  Last for a few seconds.  She feels like she has to catch herself so as not to fall.  She does not notice her heart rate racing or skipping.  She does not have any chest pressure associated with this.  She has not noticed her blood pressure being abnormal with this.  She does not take her blood pressure during these episodes because symptoms only last very briefly.  She has not had any frank syncope.  She never had these symptoms before.  She does seem to describe some mild orthostatic symptoms.   Past Medical History:  Diagnosis Date  . Anemia    4-5 yrs ago   . Arthritis    back   . Back pain   . Cataract    right eye, lens implant   . Constipation    uses stool softener daily but will go 5 days between stools   . Hyperlipidemia   . Hypertension   . Stroke [facility])    [phone]  . Unstable angina [facility])    Past Surgical History:  Procedure Laterality Date  . APPENDECTOMY    . LEFT HEART CATH AND CORONARY ANGIOGRAPHY N/A 04/13/2018   Procedure: LEFT HEART CATH AND CORONARY ANGIOGRAPHY;  Surgeon: Nelva Bush, MD;  Location: [name];  Service: Cardiovascular;  Laterality: N/A;     Current Outpatient Medications  Medication Sig Dispense Refill  . atorvastatin (LIPITOR) 20 MG tablet Take 1  tablet (20 mg total) by mouth daily. 90 tablet 1  . bisoprolol-hydrochlorothiazide (ZIAC) 2.5-6.25 MG tablet Take 2 tablets by mouth daily. 180 tablet 0  . diclofenac Sodium (VOLTAREN) 1 % GEL Apply 2 g topically 4 (four) times daily. 100 g 2  . Docusate Sodium (DULCOLAX STOOL SOFTENER PO) Take by mouth daily.    Marland Kitchen gabapentin (NEURONTIN) 100 MG capsule Take 1 capsule (100 mg total) by mouth at bedtime. 30 capsule 3  . methylPREDNISolone (MEDROL DOSEPAK) 4 MG TBPK tablet Take 6 day medrol dose pak as directed 21 tablet 0  . naproxen sodium (ALEVE) 220 MG tablet Take 220 mg by mouth.    . nitroGLYCERIN 2.5 MG CR capsule Take 1 capsule (2.5 mg total) by mouth daily. 90 capsule 1  . traMADol-acetaminophen (ULTRACET) 37.5-325 MG tablet Take 1 tablet by mouth every 6 (six) hours as needed. 30 tablet 0   No current facility-administered medications for this visit.    Allergies:   Patient has no known allergies.    ROS:  Please see the history of present illness.   Otherwise, review of systems are positive for none.   All other systems are reviewed and negative.    PHYSICAL EXAM: VS:  Ht 5\' 4"  (1.626 m)   Wt 164 lb 3.2 oz (74.5 kg)   BMI 28.18 kg/m  , BMI Body mass index is 28.18 kg/m. GENERAL:  Well appearing NECK:  No jugular venous distention, waveform within normal limits, carotid upstroke brisk and symmetric, no bruits, no thyromegaly LUNGS:  Clear to auscultation bilaterally CHEST:  Unremarkable HEART:  PMI not displaced or sustained,S1 and S2 within normal limits, no S3, no S4, no clicks, no rubs, no murmurs ABD:  Flat, positive bowel sounds normal in frequency in pitch, no bruits, no rebound, no guarding, no midline pulsatile mass, no hepatomegaly, no splenomegaly EXT:  2 plus pulses throughout, no edema, no cyanosis no clubbing    EKG:  EKG is  ordered today. The ekg ordered today demonstrates sinus rhythm, rate 65,  axis within normal limits, intervals within normal limits,  anterolateral T wave inversion consistent with ischemia but unchanged from previous  Recent Labs: 01/24/2020: ALT 14; BUN 14; Creatinine, Ser 0.81; Potassium 3.5; Sodium 140    Lipid Panel    Component Value Date/Time   CHOL 280 (H) 01/24/2020 1457   TRIG 71 01/24/2020 1457   HDL 70 01/24/2020 1457   CHOLHDL 4.0 01/24/2020 1457   LDLCALC 199 (H) 01/24/2020 1457      Wt Readings from Last 3 Encounters:  04/10/20 164 lb 3.2 oz (74.5 kg)  04/09/20 167 lb (75.8 kg)  04/02/20 165 lb (74.8 kg)      Other studies Reviewed: Additional studies/ records that were reviewed today include: None Review of the above records demonstrates:  Please see elsewhere in the note.     ASSESSMENT AND PLAN:  CHEST PAIN:     She has not had this complaint as a significant problem any longer and had normal coronaries.  I reviewed the films with her and her family in the office today.  HTN:   She was little hypertensive but had not taken her meds this morning.  She is going to keep a blood pressure diary.  DSYLIPIDEMIA:   LDL was recently markedly elevated at 199 and she has had her Lipitor adjusted by Minette Brine, FNP.  I will defer to her management.  DIZZINESS: She had a little bit of an orthostatic blood pressure drop with standing but not with sitting.  Symptoms did not correlate.  I do not think they were related but I did suggest compression stockings.  I think more likely this is in her ear and she could try meclizine per her primary provider or see ENT.  She thought it might be the Lipitor and I would think this unrelated.  No further cardiac testing is suggested.  COVID EDUCATION: She has not been vaccinated we had a long discussion about this.  Current medicines are reviewed at length with the patient today.  The patient does not have concerns regarding medicines.  The following changes have been made:  no change  Labs/ tests ordered today include:   Orders Placed This Encounter    Procedures  . EKG 12-Lead     Disposition:   FU with me based on the results.     Signed, Minus Breeding, MD  04/10/2020 9:32 AM    [facility]

## 2020-04-09 ENCOUNTER — Ambulatory Visit (INDEPENDENT_AMBULATORY_CARE_PROVIDER_SITE_OTHER): Payer: Medicaid Other | Admitting: Specialist

## 2020-04-09 ENCOUNTER — Ambulatory Visit: Payer: Self-pay

## 2020-04-09 ENCOUNTER — Encounter: Payer: Self-pay | Admitting: Specialist

## 2020-04-09 VITALS — BP 146/80 | HR 67 | Ht 64.0 in | Wt 167.0 lb

## 2020-04-09 DIAGNOSIS — M4310 Spondylolisthesis, site unspecified: Secondary | ICD-10-CM | POA: Diagnosis not present

## 2020-04-09 DIAGNOSIS — M4316 Spondylolisthesis, lumbar region: Secondary | ICD-10-CM | POA: Diagnosis not present

## 2020-04-09 DIAGNOSIS — M48062 Spinal stenosis, lumbar region with neurogenic claudication: Secondary | ICD-10-CM

## 2020-04-09 DIAGNOSIS — M48061 Spinal stenosis, lumbar region without neurogenic claudication: Secondary | ICD-10-CM | POA: Diagnosis not present

## 2020-04-09 MED ORDER — METHYLPREDNISOLONE 4 MG PO TBPK
ORAL_TABLET | ORAL | 0 refills | Status: AC
Start: 2020-04-09 — End: ?

## 2020-04-09 MED ORDER — TRAMADOL-ACETAMINOPHEN 37.5-325 MG PO TABS: 1.0000 | ORAL_TABLET | Freq: Four times a day (QID) | ORAL | 0 refills | Status: DC | PRN

## 2020-04-09 MED ORDER — GABAPENTIN 100 MG PO CAPS: 100.0000 mg | ORAL_CAPSULE | Freq: Every day | ORAL | 3 refills | Status: DC

## 2020-04-09 NOTE — Patient Instructions (Addendum)
Avoid bending, stooping and avoid lifting weights greater than 10 lbs. Avoid prolong standing and walking. Use of a cane in the left hand when walking. Surgery scheduling secretary Kandice Hams, will call you in the next week to schedule for surgery.  Surgery recommended is a one level lumbar fusion L4-5  this would be done with rods, screws and cages with local bone graft and allograft (donor bone graft).Also will perform bilateral partial hemilaminectomies at L3-4 also using the operating room microscope.  Take hydrocodone for for pain. Risk of surgery includes risk of infection 1 in 200 patients, bleeding 1/2% chance you would need a transfusion.   Risk to the nerves is one in 10,000. You will need to use a brace for 3 months and wean from the brace on the 4th month. Expect improved walking and standing tolerance. Expect relief of leg pain but numbness may persist depending on the length and degree of pressure that has been present.

## 2020-04-09 NOTE — Progress Notes (Signed)
Office Visit Note   Patient: Vicki Hall           Date of Birth: 1955-08-20           MRN: [id number] Visit Date: 04/09/2020              Requested by: Garald Balding, [address] [place],  [place] [address] PCP: Minette Brine, FNP   Assessment & Plan: Visit Diagnoses:  1. Spinal stenosis of lumbar region, unspecified whether neurogenic claudication present   2. Spondylolisthesis, lumbar region   3. Anterolisthesis     Plan: Avoid bending, stooping and avoid lifting weights greater than 10 lbs. Avoid prolong standing and walking.  Surgery scheduling secretary Kandice Hams, will call you in the next week to schedule for surgery.  Surgery recommended is a one level lumbar fusion L4-5  this would be done with rods, screws and cages with local bone graft and allograft (donor bone graft).Also will perform bilateral partial hemilaminectomies at L3-4 also using the operating room microscope.  Take hydrocodone for for pain. Risk of surgery includes risk of infection 1 in 200 patients, bleeding 1/2% chance you would need a transfusion.   Risk to the nerves is one in 10,000. You will need to use a brace for 3 months and wean from the brace on the 4th month. Expect improved walking and standing tolerance. Expect relief of leg pain but numbness may persist depending on the length and degree of pressure that has been present.  Follow-Up Instructions: No follow-ups on file.   Orders:  Orders Placed This Encounter  Procedures  . XR Lumbar Spine 2-3 Views   No orders of the defined types were placed in this encounter.     Procedures: No procedures performed   Clinical Data: No additional findings.   Subjective: Chief Complaint  Patient presents with  . Lower Back - Follow-up    64 year old female with history of back pain for years then over the last one year it has gone into the back of the legs, one leg or the other and sometimes both legs. There is pain and    Numbness and tingling. The legs feel weak and for the last couple weeks there is an uncontrolled kick that comes. She does catch her foot some times. There is pain that is worse with  Sitting, and standing and walking bothers the back and causes pain. There is more back pain then leg pain though. She has taken motrin and advil for the pain and it is not of much benefit now. It last maybe one hour or so and sometimes not even that. She was seen in the past by  Dr. Durward Fortes and has had MRIs, did PT 3 years ago, then stopped, was seen by Dr. Durward Fortes 2 years ago. After MRI result she was referred for evaluation.    Review of Systems  Constitutional: Positive for activity change. Negative for appetite change, chills, diaphoresis, fatigue, fever and unexpected weight change.  HENT: Negative.  Negative for congestion, dental problem, drooling, ear discharge, ear pain, facial swelling, hearing loss, mouth sores, nosebleeds, postnasal drip, rhinorrhea, sinus pressure, sinus pain, sneezing, sore throat, tinnitus, trouble swallowing and voice change.   Eyes: Positive for visual disturbance (had cataract surgery 3-4 years ago). Negative for photophobia, pain, discharge, redness and itching.  Respiratory: Negative.  Negative for apnea, cough, choking, chest tightness, shortness of breath, wheezing and stridor.   Cardiovascular: Negative.  Negative for chest pain, palpitations  and leg swelling.  Gastrointestinal: Positive for constipation. Negative for abdominal distention, abdominal pain, anal bleeding, blood in stool, diarrhea, nausea, rectal pain and vomiting.  Endocrine: Negative.  Negative for cold intolerance, heat intolerance, polydipsia, polyphagia and polyuria.  Genitourinary: Negative.  Negative for difficulty urinating, dyspareunia, dysuria, enuresis and flank pain.  Musculoskeletal: Positive for back pain. Negative for arthralgias, gait problem, joint swelling, myalgias, neck pain and neck stiffness.   Skin: Negative.  Negative for color change, pallor, rash and wound.  Allergic/Immunologic: Negative for environmental allergies, food allergies and immunocompromised state.  Neurological: Positive for dizziness, weakness, light-headedness and numbness. Negative for tremors, seizures, syncope, facial asymmetry, speech difficulty and headaches.  Hematological: Negative for adenopathy. Does not bruise/bleed easily.  Psychiatric/Behavioral: Negative.  Negative for agitation, behavioral problems, confusion, decreased concentration, dysphoric mood, hallucinations, self-injury, sleep disturbance and suicidal ideas. The patient is not nervous/anxious and is not hyperactive.      Objective: Vital Signs: BP (!) 146/80 (BP Location: Left Arm, Patient Position: Sitting)   Pulse 67   Ht 5\' 4"  (1.626 m)   Wt 167 lb (75.8 kg)   BMI 28.67 kg/m   Physical Exam Constitutional:      Appearance: She is well-developed.  HENT:     Head: Normocephalic and atraumatic.  Eyes:     Pupils: Pupils are equal, round, and reactive to light.  Pulmonary:     Effort: Pulmonary effort is normal.     Breath sounds: Normal breath sounds.  Abdominal:     General: Bowel sounds are normal.     Palpations: Abdomen is soft.  Musculoskeletal:        General: Normal range of motion.     Cervical back: Normal range of motion and neck supple.  Skin:    General: Skin is warm and dry.  Neurological:     Mental Status: She is alert and oriented to person, place, and time.  Psychiatric:        Behavior: Behavior normal.        Thought Content: Thought content normal.        Judgment: Judgment normal.     Ortho Exam  Specialty Comments:  No specialty comments available.  Imaging: No results found.   PMFS History: Patient Active Problem List   Diagnosis Date Noted  . Educated about COVID-19 virus infection 04/08/2020  . Precordial chest pain 04/08/2020  . Spinal stenosis of lumbar region 02/14/2020  .  Retrolisthesis of vertebrae 02/14/2020  . Elevated cholesterol 01/24/2020  . Anterolisthesis 04/10/2019  . HNP (herniated nucleus pulposus), lumbar 04/10/2019  . Unstable angina ([place]) 04/10/2018  . Pre-operative laboratory examination 04/10/2018  . Other fatigue 04/10/2018  . Abnormal stress test 04/10/2018  . Dyslipidemia 04/10/2018  . Essential hypertension 04/10/2018   Past Medical History:  Diagnosis Date  . Anemia    4-5 yrs ago   . Arthritis    back   . Back pain   . Cataract    right eye, lens implant   . Constipation    uses stool softener daily but will go 5 days between stools   . Hyperlipidemia   . Hypertension   . Stroke [facility])    [phone]  . Unstable angina (HCC)     Family History  Problem Relation Age of Onset  . Pancreatic cancer Mother   . Lung cancer Mother   . Liver cancer Father   . Esophageal cancer Brother   . Colon cancer Neg Hx   .  Colon polyps Neg Hx   . Rectal cancer Neg Hx   . Stomach cancer Neg Hx     Past Surgical History:  Procedure Laterality Date  . APPENDECTOMY    . LEFT HEART CATH AND CORONARY ANGIOGRAPHY N/A 04/13/2018   Procedure: LEFT HEART CATH AND CORONARY ANGIOGRAPHY;  Surgeon: Nelva Bush, MD;  Location: [facility];  Service: Cardiovascular;  Laterality: N/A;   Social History   Occupational History  . Not on file  Tobacco Use  . Smoking status: Never Smoker  . Smokeless tobacco: Never Used  Substance and Sexual Activity  . Alcohol use: Never  . Drug use: Never  . Sexual activity: Not on file

## 2020-04-10 ENCOUNTER — Ambulatory Visit (INDEPENDENT_AMBULATORY_CARE_PROVIDER_SITE_OTHER): Payer: Medicaid Other | Admitting: Cardiology

## 2020-04-10 ENCOUNTER — Other Ambulatory Visit: Payer: Self-pay

## 2020-04-10 ENCOUNTER — Encounter: Payer: Self-pay | Admitting: Gastroenterology

## 2020-04-10 ENCOUNTER — Encounter: Payer: Self-pay | Admitting: Cardiology

## 2020-04-10 VITALS — Ht 64.0 in | Wt 164.2 lb

## 2020-04-10 DIAGNOSIS — R072 Precordial pain: Secondary | ICD-10-CM

## 2020-04-10 DIAGNOSIS — E785 Hyperlipidemia, unspecified: Secondary | ICD-10-CM | POA: Diagnosis not present

## 2020-04-10 DIAGNOSIS — Z7189 Other specified counseling: Secondary | ICD-10-CM

## 2020-04-10 DIAGNOSIS — I10 Essential (primary) hypertension: Secondary | ICD-10-CM | POA: Diagnosis not present

## 2020-04-10 NOTE — Patient Instructions (Signed)
Medication Instructions:  Your physician recommends that you continue on your current medications as directed. Please refer to the Current Medication list given to you today.  Follow-Up: At [facility], you and your health needs are our priority.  As part of our continuing mission to provide you with exceptional heart care, we have created designated Provider Care Teams.  These Care Teams include your primary Cardiologist (physician) and Advanced Practice Providers (APPs -  Physician Assistants and Nurse Practitioners) who all work together to provide you with the care you need, when you need it.  We recommend signing up for the patient portal called "MyChart".  Sign up information is provided on this After Visit Summary.  MyChart is used to connect with patients for Virtual Visits (Telemedicine).  Patients are able to view lab/test results, encounter notes, upcoming appointments, etc.  Non-urgent messages can be sent to your provider as well.   To learn more about what you can do with MyChart, go to [email or web address].    Your next appointment:   AS NEEDED with Dr. Hochrein   

## 2020-04-28 ENCOUNTER — Telehealth: Payer: Self-pay

## 2020-04-28 NOTE — Telephone Encounter (Signed)
   Primary Cardiologist: Minus Breeding, MD  Chart reviewed as part of pre-operative protocol coverage. Patient was contacted 04/28/2020 in reference to pre-operative risk assessment for pending surgery as outlined below.  Vicki Hall was last seen on 04/10/20 by Dr. Percival Spanish.  Since that day, Vicki Hall has done well. She is unable to complete 4.0 METS due to back pain. However, heart cath in 2019 with angiographically normal coronaries. I spoke with her daughter who helps with translation. She understands that she is at higher risk (0.9%) of a major cardiac complication during the perioperative period, and agrees to proceed with surgery.    Therefore, based on ACC/AHA guidelines, the patient would be at acceptable risk for the planned procedure without further cardiovascular testing.   The patient was advised that if she develops new symptoms prior to surgery to contact our office to arrange for a follow-up visit, and she verbalized understanding.  I will route this recommendation to the requesting party via Epic fax function and remove from pre-op pool. Please call with questions.  Tami Lin Azariah Latendresse, PA 04/28/2020, 3:22 PM

## 2020-04-28 NOTE — Telephone Encounter (Signed)
   [facility] Pre-operative Risk Assessment    Request for surgical clearance:  1. What type of surgery is being performed? LUMBAR FUSION-BILAT PARTIAL HEMILAMINECTOMIES   2. When is this surgery scheduled? TBD   3. What type of clearance is required (medical clearance vs. Pharmacy clearance to hold med vs. Both)? MEDICAL  4. Are there any medications that need to be held prior to surgery and how long?NONE LISTED   5. Practice name and name of physician performing surgery? [facility] Basil Dess ATTN:[email or web address]   6. What is the office phone number? [phone]   7.   What is the office fax number? [phone]  8.   Anesthesia type (None, local, MAC, general) ? GENERAL

## 2020-04-30 ENCOUNTER — Encounter: Payer: Self-pay | Admitting: Specialist

## 2020-04-30 ENCOUNTER — Ambulatory Visit: Payer: Medicaid Other | Admitting: Specialist

## 2020-04-30 ENCOUNTER — Other Ambulatory Visit: Payer: Self-pay

## 2020-04-30 VITALS — BP 145/87 | HR 61 | Ht 64.0 in | Wt 164.0 lb

## 2020-04-30 DIAGNOSIS — M48062 Spinal stenosis, lumbar region with neurogenic claudication: Secondary | ICD-10-CM | POA: Diagnosis not present

## 2020-04-30 DIAGNOSIS — M4316 Spondylolisthesis, lumbar region: Secondary | ICD-10-CM | POA: Diagnosis not present

## 2020-04-30 DIAGNOSIS — M5126 Other intervertebral disc displacement, lumbar region: Secondary | ICD-10-CM

## 2020-04-30 DIAGNOSIS — M4310 Spondylolisthesis, site unspecified: Secondary | ICD-10-CM | POA: Diagnosis not present

## 2020-04-30 MED ORDER — GABAPENTIN 100 MG PO CAPS: 100.0000 mg | ORAL_CAPSULE | Freq: Every day | ORAL | 3 refills | Status: AC

## 2020-04-30 MED ORDER — TRAMADOL-ACETAMINOPHEN 37.5-325 MG PO TABS: 1.0000 | ORAL_TABLET | Freq: Four times a day (QID) | ORAL | 0 refills | Status: DC | PRN

## 2020-04-30 NOTE — Progress Notes (Signed)
Office Visit Note   Patient: Vicki Hall           Date of Birth: [birth year]           MRN: [id number] Visit Date: 04/30/2020              Requested by: Vicki Hall, Vicki Hall,  Vicki Hall [id number] PCP: Vicki Hall   Assessment & Plan: Visit Diagnoses:  1. Spinal stenosis of lumbar region with neurogenic claudication   2. Spondylolisthesis, lumbar region   3. HNP (herniated nucleus pulposus), lumbar   4. Anterolisthesis     Plan: Avoid bending, stooping and avoid lifting weights greater than 10 lbs. Avoid prolong standing and walking.  Surgery scheduling secretary Vicki Hall, will call you in the next week to schedule for surgery.  Surgery recommended is a one level lumbar fusion L4-5  this would be done with rods, screws and cages with local bone graft and allograft (donor bone graft).Also will perform bilateral partial hemilaminectomies at L3-4 also using the operating room microscope.  Take ultracet and gabapentin for for pain. Risk of surgery includes risk of infection 1 in 200 patients, bleeding 1/2% chance you would need a transfusion.   Risk to the nerves is one in 10,000. You will need to use a brace for 3 months and wean from the brace on the 4th month. Expect improved walking and standing tolerance. Expect relief of leg pain but numbness  may persist depending on the length and degree of pressure that has been present. Family prefers schedule early January surgery.  Follow-Up Instructions: Return in about 6 weeks (around 06/11/2020).   Orders:  No orders of the defined types were placed in this encounter.  No orders of the defined types were placed in this encounter.     Procedures: No procedures performed   Clinical Data: No additional findings.   Subjective: Chief Complaint  Patient presents with  . Lower Back - Follow-up    64 year old female with spondylolisthesis and spinal stenosis. She is having  difficulty with standing and walking even short distances. Pain is back and into the thighs and legs with prolong greater than 5-10 minutes standing and walking. She has medical comorbidities And has been evaluated by cardiology with clearance and has been told that she has low risk, less than 1% concern of cardiac event With surgery. Family is ready to proceed with surgery with current risks known. Best time is apparently early next year.  No bowel or bladder dysfunction, she is taking ultracet and gabapentin for pain and able to be comfortable sitting.    Review of Systems  Constitutional: Negative.   HENT: Negative.   Eyes: Negative.   Respiratory: Negative.   Cardiovascular: Negative.   Gastrointestinal: Negative.   Endocrine: Negative.   Genitourinary: Negative.   Musculoskeletal: Negative.   Skin: Negative.   Allergic/Immunologic: Negative.   Neurological: Negative.   Hematological: Negative.   Psychiatric/Behavioral: Negative.      Objective: Vital Signs: BP (!) 145/87 (BP Location: Left Arm, Patient Position: Sitting)   Pulse 61   Ht 5\' 4"  (1.626 m)   Wt 164 lb (74.4 kg)   BMI 28.15 kg/m   Physical Exam Constitutional:      Appearance: She is well-developed.  HENT:     Head: Normocephalic and atraumatic.  Eyes:     Pupils: Pupils are equal, round, and reactive to light.  Pulmonary:     Effort: Pulmonary  effort is normal.     Breath sounds: Normal breath sounds.  Abdominal:     General: Bowel sounds are normal.     Palpations: Abdomen is soft.  Musculoskeletal:     Cervical back: Normal range of motion and neck supple.     Lumbar back: Negative right straight leg raise test and negative left straight leg raise test.  Skin:    General: Skin is warm and dry.  Neurological:     Mental Status: She is alert and oriented to person, place, and time.  Psychiatric:        Behavior: Behavior normal.        Thought Content: Thought content normal.        Judgment:  Judgment normal.     Back Exam   Tenderness  The patient is experiencing tenderness in the lumbar.  Range of Motion  Extension: abnormal  Flexion: normal  Lateral bend right: abnormal  Lateral bend left: abnormal  Rotation right: abnormal  Rotation left: abnormal   Muscle Strength  Right Quadriceps:  5/5  Left Quadriceps:  5/5  Right Hamstrings:  5/5  Left Hamstrings:  5/5   Tests  Straight leg raise right: negative Straight leg raise left: negative  Reflexes  Patellar: 2/4 Achilles: 1/4 Biceps: 0/4 Babinski's sign: normal   Other  Toe walk: normal Heel walk: normal Sensation: normal Erythema: no back redness Scars: absent  Comments:  Left foot DF 5-/5, left ankle reflex is absent.      Specialty Comments:  No specialty comments available.  Imaging: No results found.   PMFS History: Patient Active Problem List   Diagnosis Date Noted  . Educated about COVID-19 virus infection 04/08/2020  . Precordial chest pain 04/08/2020  . Spinal stenosis of lumbar region 02/14/2020  . Retrolisthesis of vertebrae 02/14/2020  . Elevated cholesterol 01/24/2020  . Anterolisthesis 04/10/2019  . HNP (herniated nucleus pulposus), lumbar 04/10/2019  . Unstable angina ([place]) 04/10/2018  . Pre-operative laboratory examination 04/10/2018  . Other fatigue 04/10/2018  . Abnormal stress test 04/10/2018  . Dyslipidemia 04/10/2018  . Essential hypertension 04/10/2018   Past Medical History:  Diagnosis Date  . Anemia    4-5 yrs ago   . Arthritis    back   . Back pain   . Cataract    right eye, lens implant   . Constipation    uses stool softener daily but will go 5 days between stools   . Hyperlipidemia   . Hypertension   . Stroke [facility])    [phone]  . Unstable angina ([facility])     Family History  Problem Relation Age of Onset  . Pancreatic cancer Mother   . Lung cancer Mother   . Liver cancer Father   . Esophageal cancer Brother   . Colon cancer Neg Hx   .  Colon polyps Neg Hx   . Rectal cancer Neg Hx   . Stomach cancer Neg Hx     Past Surgical History:  Procedure Laterality Date  . APPENDECTOMY    . LEFT HEART CATH AND CORONARY ANGIOGRAPHY N/A 04/13/2018   Procedure: LEFT HEART CATH AND CORONARY ANGIOGRAPHY;  Surgeon: Nelva Bush, MD;  Location: [place] [facility];  Service: Cardiovascular;  Laterality: N/A;   Social History   Occupational History  . Not on file  Tobacco Use  . Smoking status: Never Smoker  . Smokeless tobacco: Never Used  Substance and Sexual Activity  . Alcohol use: Never  . Drug  use: Never  . Sexual activity: Not on file

## 2020-04-30 NOTE — Patient Instructions (Signed)
Plan: Avoid bending, stooping and avoid lifting weights greater than 10 lbs. Avoid prolong standing and walking.  Surgery scheduling secretary Kandice Hams, will call you in the next week to schedule for surgery.  Surgery recommended is a one level lumbar fusion L4-5  this would be done with rods, screws and cages with local bone graft and allograft (donor bone graft).Also will perform bilateral partial hemilaminectomies at L3-4 also using the operating room microscope.  Take hydrocodone for for pain. Risk of surgery includes risk of infection 1 in 200 patients, bleeding 1/2% chance you would need a transfusion.   Risk to the nerves is one in 10,000. You will need to use a brace for 3 months and wean from the brace on the 4th month. Expect improved walking and standing tolerance. Expect relief of leg pain but numbness may persist depending on the length and degree of pressure that has been present.

## 2020-06-11 ENCOUNTER — Other Ambulatory Visit: Payer: Self-pay

## 2020-06-11 ENCOUNTER — Encounter: Payer: Self-pay | Admitting: Specialist

## 2020-06-11 ENCOUNTER — Ambulatory Visit (INDEPENDENT_AMBULATORY_CARE_PROVIDER_SITE_OTHER): Payer: Medicaid Other

## 2020-06-11 ENCOUNTER — Ambulatory Visit (INDEPENDENT_AMBULATORY_CARE_PROVIDER_SITE_OTHER): Payer: Medicaid Other | Admitting: Specialist

## 2020-06-11 VITALS — BP 142/82 | HR 73 | Ht 64.0 in | Wt 164.0 lb

## 2020-06-11 DIAGNOSIS — M4316 Spondylolisthesis, lumbar region: Secondary | ICD-10-CM

## 2020-06-11 DIAGNOSIS — M48062 Spinal stenosis, lumbar region with neurogenic claudication: Secondary | ICD-10-CM

## 2020-06-11 DIAGNOSIS — G8929 Other chronic pain: Secondary | ICD-10-CM | POA: Diagnosis not present

## 2020-06-11 DIAGNOSIS — M25561 Pain in right knee: Secondary | ICD-10-CM

## 2020-06-11 DIAGNOSIS — M4310 Spondylolisthesis, site unspecified: Secondary | ICD-10-CM | POA: Diagnosis not present

## 2020-06-11 DIAGNOSIS — M5126 Other intervertebral disc displacement, lumbar region: Secondary | ICD-10-CM

## 2020-06-11 NOTE — Progress Notes (Signed)
Office Visit Note   Patient: Vicki Hall           Date of Birth: 1956/04/02           MRN: [id number] Visit Date: 06/11/2020              Requested by: Minette Brine, [place] [address] [place],  [place] [address] PCP: Minette Brine, FNP   Assessment & Plan: Visit Diagnoses:  1. Spondylolisthesis, lumbar region   2. HNP (herniated nucleus pulposus), lumbar   3. Anterolisthesis   4. Spinal stenosis of lumbar region with neurogenic claudication   5. Retrolisthesis of vertebrae     Plan:Avoid bending, stooping and avoid lifting weights greater than 10-15lbs. Avoid prolong standing and walking.  Surgery scheduling secretary Kandice Hams, will call you in the next week to schedule for surgery.  Surgery recommended is a one level lumbar fusion L4-5 this would be done with rods, screws and cages with local bone graft and allograft (donor bone graft).Also will perform bilateral partial hemilaminectomies at L3-4 also using the operating room microscope.  Take ultracet and gabapentin for for pain. Risk of surgery includes risk of infection 1 in 200 patients, bleeding 1/2% chance you would need a transfusion. Risk to the nerves is one in 10,000. You will need to use a brace for 3 months and wean from the brace on the 4th month. Expect improved walking and standing tolerance. Expect relief of leg pain but numbness  may persist depending on the length and degree of pressure that has been present. Family prefers schedule early January surgery. Knee is suffering from osteoarthritis, only real proven treatments are Weight loss, NSIADs like tylenol and exercise. Well padded shoes help. Ice the knee that is suffering from osteoarthritis.  Well padded shoes help. Ice the knee 2-3 times a day 15-20 mins at a time.-3 times a day 15-20 mins at a time. Hot showers in the AM.  Injection with steroid may be of benefit. Hemp CBD capsules, amazon.com 5,000-7,000 mg per bottle, 60  capsules per bottle, take one capsule twice a day. Cane in the right hand to use with left leg weight bearing. Follow-Up Instructions: No follow-ups on file.  Follow-Up Instructions: No follow-ups on file.   Orders:  No orders of the defined types were placed in this encounter.  No orders of the defined types were placed in this encounter.     Procedures: No procedures performed   Clinical Data: No additional findings.   Subjective: Chief Complaint  Patient presents with  . Lower Back - Follow-up    64 year old female with history of back pain and radiation into the right leg, now with more right knee pain and weakness. The right knee wants to give away. The right knee feels like the right knee has some thing give over then. Pain with standing and walking and night pain right leg. Has seen Dr. Percival Spanish and he has cleared her for consideration or surgical intervention. No Bowel or bladder difficulty. She has constipation.   Review of Systems  Constitutional: Negative.   HENT: Negative.   Eyes: Negative.   Respiratory: Negative.   Cardiovascular: Negative.   Gastrointestinal: Negative.   Endocrine: Negative.   Genitourinary: Negative.   Musculoskeletal: Positive for arthralgias, back pain and gait problem.  Skin: Negative.   Allergic/Immunologic: Negative.   Hematological: Negative.   Psychiatric/Behavioral: Negative.      Objective: Vital Signs: BP (!) 142/82 (BP Location: Left Arm, Patient  Position: Sitting)   Pulse 73   Ht 5\' 4"  (1.626 m)   Wt 164 lb (74.4 kg)   BMI 28.15 kg/m   Physical Exam Constitutional:      Appearance: She is well-developed.  HENT:     Head: Normocephalic and atraumatic.  Eyes:     Pupils: Pupils are equal, round, and reactive to light.  Pulmonary:     Effort: Pulmonary effort is normal.     Breath sounds: Normal breath sounds.  Abdominal:     General: Bowel sounds are normal.     Palpations: Abdomen is soft.  Musculoskeletal:         General: Normal range of motion.     Cervical back: Normal range of motion and neck supple.  Skin:    General: Skin is warm and dry.  Neurological:     Mental Status: She is alert and oriented to person, place, and time.  Psychiatric:        Behavior: Behavior normal.        Thought Content: Thought content normal.        Judgment: Judgment normal.     Ortho Exam  Specialty Comments:  No specialty comments available.  Imaging: No results found.   PMFS History: Patient Active Problem List   Diagnosis Date Noted  . Educated about COVID-19 virus infection 04/08/2020  . Precordial chest pain 04/08/2020  . Spinal stenosis of lumbar region 02/14/2020  . Retrolisthesis of vertebrae 02/14/2020  . Elevated cholesterol 01/24/2020  . Anterolisthesis 04/10/2019  . HNP (herniated nucleus pulposus), lumbar 04/10/2019  . Unstable angina ([facility]) 04/10/2018  . Pre-operative laboratory examination 04/10/2018  . Other fatigue 04/10/2018  . Abnormal stress test 04/10/2018  . Dyslipidemia 04/10/2018  . Essential hypertension 04/10/2018   Past Medical History:  Diagnosis Date  . Anemia    4-5 yrs ago   . Arthritis    back   . Back pain   . Cataract    right eye, lens implant   . Constipation    uses stool softener daily but will go 5 days between stools   . Hyperlipidemia   . Hypertension   . Stroke [facility])    [phone]  . Unstable angina ([facility])     Family History  Problem Relation Age of Onset  . Pancreatic cancer Mother   . Lung cancer Mother   . Liver cancer Father   . Esophageal cancer Brother   . Colon cancer Neg Hx   . Colon polyps Neg Hx   . Rectal cancer Neg Hx   . Stomach cancer Neg Hx     Past Surgical History:  Procedure Laterality Date  . APPENDECTOMY    . LEFT HEART CATH AND CORONARY ANGIOGRAPHY N/A 04/13/2018   Procedure: LEFT HEART CATH AND CORONARY ANGIOGRAPHY;  Surgeon: Nelva Bush, MD;  Location: [facility];  Service: Cardiovascular;   Laterality: N/A;   Social History   Occupational History  . Not on file  Tobacco Use  . Smoking status: Never Smoker  . Smokeless tobacco: Never Used  Substance and Sexual Activity  . Alcohol use: Never  . Drug use: Never  . Sexual activity: Not on file

## 2020-06-11 NOTE — Patient Instructions (Addendum)
Plan:Avoid bending, stooping and avoid lifting weights greater than 10-15lbs. Avoid prolong standing and walking.  Surgery scheduling secretary Kandice Hams, will call you in the next week to schedule for surgery.  Surgery recommended is a one level lumbar fusion L4-5 this would be done with rods, screws and cages with local bone graft and allograft (donor bone graft).Also will perform bilateral partial hemilaminectomies at L3-4 also using the operating room microscope.  Take ultracet and gabapentin for for pain. Risk of surgery includes risk of infection 1 in 200 patients, bleeding 1/2% chance you would need a transfusion. Risk to the nerves is one in 10,000. You will need to use a brace for 3 months and wean from the brace on the 4th month. Expect improved walking and standing tolerance. Expect relief of leg pain but numbness  may persist depending on the length and degree of pressure that has been present. Family prefers schedule early January surgery. Knee is suffering from osteoarthritis, only real proven treatments are Weight loss, NSIADs like tylenol and exercise. Well padded shoes help. Ice the knee that is suffering from osteoarthritis.  Well padded shoes help. Ice the knee 2-3 times a day 15-20 mins at a time.-3 times a day 15-20 mins at a time. Hot showers in the AM.  Injection with steroid may be of benefit. Hemp CBD capsules, amazon.com 5,000-7,000 mg per bottle, 60 capsules per bottle, take one capsule twice a day. Cane in the right hand to use with left leg weight bearing.

## 2020-06-18 ENCOUNTER — Encounter: Payer: Self-pay | Admitting: Nurse Practitioner

## 2020-06-18 ENCOUNTER — Other Ambulatory Visit: Payer: Self-pay

## 2020-06-18 ENCOUNTER — Telehealth: Payer: Self-pay

## 2020-06-18 ENCOUNTER — Ambulatory Visit: Payer: Medicaid Other | Admitting: Nurse Practitioner

## 2020-06-18 VITALS — BP 134/84 | HR 72 | Temp 98.1°F | Ht 63.6 in | Wt 169.4 lb

## 2020-06-18 DIAGNOSIS — Z1159 Encounter for screening for other viral diseases: Secondary | ICD-10-CM

## 2020-06-18 DIAGNOSIS — Z23 Encounter for immunization: Secondary | ICD-10-CM | POA: Diagnosis not present

## 2020-06-18 DIAGNOSIS — R1032 Left lower quadrant pain: Secondary | ICD-10-CM

## 2020-06-18 DIAGNOSIS — H539 Unspecified visual disturbance: Secondary | ICD-10-CM

## 2020-06-18 DIAGNOSIS — I10 Essential (primary) hypertension: Secondary | ICD-10-CM | POA: Diagnosis not present

## 2020-06-18 DIAGNOSIS — Z0000 Encounter for general adult medical examination without abnormal findings: Secondary | ICD-10-CM | POA: Diagnosis not present

## 2020-06-18 DIAGNOSIS — R7309 Other abnormal glucose: Secondary | ICD-10-CM

## 2020-06-18 LAB — POCT URINALYSIS DIPSTICK
Bilirubin, UA: NEGATIVE
Glucose, UA: NEGATIVE
Ketones, UA: NEGATIVE
Leukocytes, UA: NEGATIVE
Nitrite, UA: NEGATIVE
Protein, UA: NEGATIVE
Spec Grav, UA: 1.02 (ref 1.010–1.025)
Urobilinogen, UA: 0.2 E.U./dL
pH, UA: 6 (ref 5.0–8.0)

## 2020-06-18 LAB — POCT UA - MICROALBUMIN
Albumin/Creatinine Ratio, Urine, POC: 30
Creatinine, POC: 50 mg/dL
Microalbumin Ur, POC: 10 mg/L

## 2020-06-18 NOTE — Progress Notes (Signed)
I,Tianna Badgett,acting as a Education administrator for [employer], FNP.,have documented all relevant documentation on the behalf of Minette Brine, FNP,as directed by  Minette Brine, FNP while in the presence of Minette Brine, [place].  This visit occurred during the SARS-CoV-2 public health emergency.  Safety protocols were in place, including screening questions prior to the visit, additional usage of staff PPE, and extensive cleaning of exam room while observing appropriate contact time as indicated for disinfecting solutions.  Subjective:     Patient ID: Vicki Hall , female    DOB: March 31, 1956 , 64 y.o.   MRN: [id number]   Chief Complaint  Patient presents with  . Annual Exam    HPI  Patient is here today for physical exam. She is here today with an Arabic interpreter Bedor.      Past Medical History:  Diagnosis Date  . Anemia    4-5 yrs ago   . Arthritis    back   . Back pain   . Cataract    right eye, lens implant   . Constipation    uses stool softener daily but will go 5 days between stools   . Hyperlipidemia   . Hypertension   . Stroke [facility])    [phone]  . Unstable angina (HCC)      Family History  Problem Relation Age of Onset  . Pancreatic cancer Mother   . Lung cancer Mother   . Liver cancer Father   . Esophageal cancer Brother   . Colon cancer Neg Hx   . Colon polyps Neg Hx   . Rectal cancer Neg Hx   . Stomach cancer Neg Hx      Current Outpatient Medications:  .  atorvastatin (LIPITOR) 20 MG tablet, Take 1 tablet (20 mg total) by mouth daily., Disp: 90 tablet, Rfl: 1 .  bisoprolol-hydrochlorothiazide (ZIAC) 2.5-6.25 MG tablet, Take 2 tablets by mouth daily., Disp: 180 tablet, Rfl: 0 .  diclofenac Sodium (VOLTAREN) 1 % GEL, Apply 2 g topically 4 (four) times daily., Disp: 100 g, Rfl: 2 .  Docusate Sodium (DULCOLAX STOOL SOFTENER PO), Take by mouth daily., Disp: , Rfl:  .  gabapentin (NEURONTIN) 100 MG capsule, Take 1 capsule (100 mg total) by mouth at bedtime.,  Disp: 30 capsule, Rfl: 3 .  methylPREDNISolone (MEDROL DOSEPAK) 4 MG TBPK tablet, Take 6 day medrol dose pak as directed, Disp: 21 tablet, Rfl: 0 .  naproxen sodium (ALEVE) 220 MG tablet, Take 220 mg by mouth., Disp: , Rfl:  .  nitroGLYCERIN 2.5 MG CR capsule, Take 1 capsule (2.5 mg total) by mouth daily., Disp: 90 capsule, Rfl: 1 .  traMADol-acetaminophen (ULTRACET) 37.5-325 MG tablet, Take 1 tablet by mouth every 6 (six) hours as needed., Disp: 30 tablet, Rfl: 0   No Known Allergies    The patient states she uses post menopausal status for birth control.  No LMP recorded. Patient is postmenopausal.. Negative for Dysmenorrhea and Negative for Menorrhagia. Negative for: breast discharge, breast lump(s), breast pain and breast self exam. Associated symptoms include abnormal vaginal bleeding. Pertinent negatives include abnormal bleeding (hematology), anxiety, decreased libido, depression, difficulty falling sleep, dyspareunia, history of infertility, nocturia, sexual dysfunction, sleep disturbances, urinary incontinence, urinary urgency, vaginal discharge and vaginal itching. Diet regular. The patient states her exercise level is none, due to her chronic back problems. They are awaiting an appt for back surgery for a fusion, planned to be out for 3-4 months [name])    The patient's tobacco use is:  Social History   Tobacco Use  Smoking Status Never Smoker  Smokeless Tobacco Never Used  . She has been exposed to passive smoke. The patient's alcohol use is:  Social History   Substance and Sexual Activity  Alcohol Use Never  Additional information: Last pap unknown, patient states she has had an IUD for quite some time she thinks about 30 years. She is not concerned at this time.    Review of Systems  Constitutional: Negative.   HENT: Negative.   Eyes: Negative.   Respiratory: Negative.   Cardiovascular: Negative.   Gastrointestinal: Negative.   Endocrine: Negative.   Genitourinary:  Negative.   Musculoskeletal: Positive for back pain (chronic, she is awaiting surgery).  Skin: Negative.   Allergic/Immunologic: Negative.   Neurological: Negative.   Hematological: Negative.   Psychiatric/Behavioral: Negative.      Today's Vitals   06/18/20 1028  BP: 134/84  Pulse: 72  Temp: 98.1 F (36.7 C)  TempSrc: Oral  Weight: 169 lb 6.4 oz (76.8 kg)  Height: 5' 3.6" (1.615 m)  PainSc: 0-No pain   Body mass index is 29.44 kg/m.   Objective:  Physical Exam Constitutional:      General: She is not in acute distress.    Appearance: Normal appearance. She is well-developed.  HENT:     Head: Normocephalic and atraumatic.     Right Ear: Hearing, tympanic membrane, ear canal and external ear normal. There is no impacted cerumen.     Left Ear: Hearing, tympanic membrane, ear canal and external ear normal. There is no impacted cerumen.     Nose:     Comments: Deferred - masked    Mouth/Throat:     Comments: Deferred - masked Eyes:     General: Lids are normal.     Extraocular Movements: Extraocular movements intact.     Conjunctiva/sclera: Conjunctivae normal.     Pupils: Pupils are equal, round, and reactive to light.     Funduscopic exam:    Right eye: No papilledema.        Left eye: No papilledema.  Neck:     Thyroid: No thyroid mass.     Vascular: No carotid bruit.  Cardiovascular:     Rate and Rhythm: Normal rate and regular rhythm.     Pulses: Normal pulses.     Heart sounds: Normal heart sounds. No murmur heard.   Pulmonary:     Effort: Pulmonary effort is normal. No respiratory distress.     Breath sounds: Normal breath sounds. No wheezing.  Chest:     Chest wall: No mass.  Breasts:     Tanner Score is 5.     Right: Normal. No mass, tenderness, axillary adenopathy or supraclavicular adenopathy.     Left: Normal. No mass, tenderness, axillary adenopathy or supraclavicular adenopathy.    Abdominal:     General: Abdomen is flat. Bowel sounds are  normal. There is no distension.     Palpations: Abdomen is soft.     Tenderness: There is abdominal tenderness (left lower abdomen tenderness). There is no guarding.  Musculoskeletal:        General: Tenderness (back pain) present. No swelling. Normal range of motion.     Cervical back: Full passive range of motion without pain, normal range of motion and neck supple.     Right lower leg: No edema.     Left lower leg: No edema.  Lymphadenopathy:     Upper Body:     Right  upper body: No supraclavicular, axillary or pectoral adenopathy.     Left upper body: No supraclavicular, axillary or pectoral adenopathy.  Skin:    General: Skin is warm and dry.     Capillary Refill: Capillary refill takes less than 2 seconds.  Neurological:     General: No focal deficit present.     Mental Status: She is alert and oriented to person, place, and time.     Cranial Nerves: No cranial nerve deficit.     Sensory: No sensory deficit.  Psychiatric:        Mood and Affect: Mood normal.        Behavior: Behavior normal.        Thought Content: Thought content normal.        Judgment: Judgment normal.     Comments: Use of an interpreter and her daughter are being used         Assessment And Plan:     1. Encounter for annual physical exam . Behavior modifications discussed and diet history reviewed.   . Pt will continue to exercise regularly and modify diet with low GI, plant based foods and decrease intake of processed foods.  . Recommend intake of daily multivitamin, Vitamin D, and calcium.  Recommend mammogram and colonoscopy for preventive screenings, as well as recommend immunizations that include influenza, TDAP (sent to pharmacy)  2. Encounter for hepatitis C screening test for low risk patient  Will check Hepatitis C screening due to recent recommendations to screen all adults 18 years and older - Hepatitis C antibody  3. Encounter for immunization  Sent to pharmacy  Will give tetanus  vaccine today while in office. Refer to order management. TDAP will be administered to adults 46-86 years old every 10 years. - Tdap vaccine greater than or equal to 7yo IM  4. Essential hypertension  Chronic, fair control  Continue with current medications - CBC - CMP14+EGFR  5. Vision changes  She is complaining of blurred vision  Will refer to opthalmology - Ambulatory referral to Ophthalmology  6. Abnormal glucose  Chronic, stable  No current medications, diet controlled  Encouraged to limit intake of sugary foods and drinks  Encouraged to increase physical activity to 150 minutes per week, as tolerated - Hemoglobin A1c  7. Left lower quadrant abdominal pain  On palpation she had pain but she is not interested in doing any interventions at this time, she would like to wait until after her surgery of her back  She also has not had a PAP in many years and she says her IUD is still in place, she declines a referral to GYN for further evaluation. Discussed the risk of infection that could lead to death if not treated adequately if she really does have an IUD in place   - POCT Urinalysis Dipstick (31517) - POCT UA - Microalbumin     Patient was given opportunity to ask questions. Patient verbalized understanding of the plan and was able to repeat key elements of the plan. All questions were answered to their satisfaction.    Teola Bradley, FNP, have reviewed all documentation for this visit. The documentation on 06/25/20 for the exam, diagnosis, procedures, and orders are all accurate and complete.  THE PATIENT IS ENCOURAGED TO PRACTICE SOCIAL DISTANCING DUE TO THE COVID-19 PANDEMIC.

## 2020-06-18 NOTE — Telephone Encounter (Signed)
Planning patient's surgery on 07-29-20.  Daughter needs to know how much care needed and how long patient will need care/help at home upon discharge from the hospital.  She is planning to take FMLA and needs to let her work know.  Junie Panning [phone]

## 2020-06-18 NOTE — Patient Instructions (Signed)
Health Maintenance, Female Adopting a healthy lifestyle and getting preventive care are important in promoting health and wellness. Ask your health care provider about:  The right schedule for you to have regular tests and exams.  Things you can do on your own to prevent diseases and keep yourself healthy. What should I know about diet, weight, and exercise? Eat a healthy diet   Eat a diet that includes plenty of vegetables, fruits, low-fat dairy products, and lean protein.  Do not eat a lot of foods that are high in solid fats, added sugars, or sodium. Maintain a healthy weight Body mass index (BMI) is used to identify weight problems. It estimates body fat based on height and weight. Your health care provider can help determine your BMI and help you achieve or maintain a healthy weight. Get regular exercise Get regular exercise. This is one of the most important things you can do for your health. Most adults should:  Exercise for at least 150 minutes each week. The exercise should increase your heart rate and make you sweat (moderate-intensity exercise).  Do strengthening exercises at least twice a week. This is in addition to the moderate-intensity exercise.  Spend less time sitting. Even light physical activity can be beneficial. Watch cholesterol and blood lipids Have your blood tested for lipids and cholesterol at 63 years of age, then have this test every 5 years. Have your cholesterol levels checked more often if:  Your lipid or cholesterol levels are high.  You are older than 63 years of age.  You are at high risk for heart disease. What should I know about cancer screening? Depending on your health history and family history, you may need to have cancer screening at various ages. This may include screening for:  Breast cancer.  Cervical cancer.  Colorectal cancer.  Skin cancer.  Lung cancer. What should I know about heart disease, diabetes, and high blood  pressure? Blood pressure and heart disease  High blood pressure causes heart disease and increases the risk of stroke. This is more likely to develop in people who have high blood pressure readings, are of African descent, or are overweight.  Have your blood pressure checked: ? Every 3-5 years if you are 18-39 years of age. ? Every year if you are 40 years old or older. Diabetes Have regular diabetes screenings. This checks your fasting blood sugar level. Have the screening done:  Once every three years after age 40 if you are at a normal weight and have a low risk for diabetes.  More often and at a younger age if you are overweight or have a high risk for diabetes. What should I know about preventing infection? Hepatitis B If you have a higher risk for hepatitis B, you should be screened for this virus. Talk with your health care provider to find out if you are at risk for hepatitis B infection. Hepatitis C Testing is recommended for:  Everyone born from 1945 through 1965.  Anyone with known risk factors for hepatitis C. Sexually transmitted infections (STIs)  Get screened for STIs, including gonorrhea and chlamydia, if: ? You are sexually active and are younger than 63 years of age. ? You are older than 63 years of age and your health care provider tells you that you are at risk for this type of infection. ? Your sexual activity has changed since you were last screened, and you are at increased risk for chlamydia or gonorrhea. Ask your health care provider if   you are at risk.  Ask your health care provider about whether you are at high risk for HIV. Your health care provider may recommend a prescription medicine to help prevent HIV infection. If you choose to take medicine to prevent HIV, you should first get tested for HIV. You should then be tested every 3 months for as long as you are taking the medicine. Pregnancy  If you are about to stop having your period (premenopausal) and  you may become pregnant, seek counseling before you get pregnant.  Take 400 to 800 micrograms (mcg) of folic acid every day if you become pregnant.  Ask for birth control (contraception) if you want to prevent pregnancy. Osteoporosis and menopause Osteoporosis is a disease in which the bones lose minerals and strength with aging. This can result in bone fractures. If you are 65 years old or older, or if you are at risk for osteoporosis and fractures, ask your health care provider if you should:  Be screened for bone loss.  Take a calcium or vitamin D supplement to lower your risk of fractures.  Be given hormone replacement therapy (HRT) to treat symptoms of menopause. Follow these instructions at home: Lifestyle  Do not use any products that contain nicotine or tobacco, such as cigarettes, e-cigarettes, and chewing tobacco. If you need help quitting, ask your health care provider.  Do not use street drugs.  Do not share needles.  Ask your health care provider for help if you need support or information about quitting drugs. Alcohol use  Do not drink alcohol if: ? Your health care provider tells you not to drink. ? You are pregnant, may be pregnant, or are planning to become pregnant.  If you drink alcohol: ? Limit how much you use to 0-1 drink a day. ? Limit intake if you are breastfeeding.  Be aware of how much alcohol is in your drink. In the [place] one drink equals one 12 oz bottle of beer (355 mL), one 5 oz glass of wine (148 mL), or one 1 oz glass of hard liquor (44 mL). General instructions  Schedule regular health, dental, and eye exams.  Stay current with your vaccines.  Tell your health care provider if: ? You often feel depressed. ? You have ever been abused or do not feel safe at home. Summary  Adopting a healthy lifestyle and getting preventive care are important in promoting health and wellness.  Follow your health care provider's instructions about healthy  diet, exercising, and getting tested or screened for diseases.  Follow your health care provider's instructions on monitoring your cholesterol and blood pressure. This information is not intended to replace advice given to you by your health care provider. Make sure you discuss any questions you have with your health care provider. Document Revised: 06/21/2018 Document Reviewed: 06/21/2018 Elsevier Patient Education  2020 Elsevier Inc.  

## 2020-06-19 LAB — CMP14+EGFR
ALT: 16 IU/L (ref 0–32)
AST: 13 IU/L (ref 0–40)
Albumin/Globulin Ratio: 1.6 (ref 1.2–2.2)
Albumin: 4.2 g/dL (ref 3.8–4.8)
Alkaline Phosphatase: 79 IU/L (ref 44–121)
BUN/Creatinine Ratio: 13 (ref 12–28)
BUN: 12 mg/dL (ref 8–27)
Bilirubin Total: 0.4 mg/dL (ref 0.0–1.2)
CO2: 22 mmol/L (ref 20–29)
Calcium: 9.4 mg/dL (ref 8.7–10.3)
Chloride: 102 mmol/L (ref 96–106)
Creatinine, Ser: 0.94 mg/dL (ref 0.57–1.00)
GFR calc Af Amer: 75 mL/min/{1.73_m2} (ref >59)
GFR calc non Af Amer: 65 mL/min/{1.73_m2} (ref >59)
Globulin, Total: 2.6 g/dL (ref 1.5–4.5)
Glucose: 85 mg/dL (ref 65–99)
Potassium: 3.6 mmol/L (ref 3.5–5.2)
Sodium: 139 mmol/L (ref 134–144)
Total Protein: 6.8 g/dL (ref 6.0–8.5)

## 2020-06-19 LAB — CBC
Hematocrit: 38.5 % (ref 34.0–46.6)
Hemoglobin: 12.8 g/dL (ref 11.1–15.9)
MCH: 26.8 pg (ref 26.6–33.0)
MCHC: 33.2 g/dL (ref 31.5–35.7)
MCV: 81 fL (ref 79–97)
Platelets: 286 10*3/uL (ref 150–450)
RBC: 4.78 x10E6/uL (ref 3.77–5.28)
RDW: 14.2 % (ref 11.7–15.4)
WBC: 6.8 10*3/uL (ref 3.4–10.8)

## 2020-06-19 LAB — HEMOGLOBIN A1C
Est. average glucose Bld gHb Est-mCnc: 128 mg/dL
Hgb A1c MFr Bld: 6.1 % — ABNORMAL HIGH (ref 4.8–5.6)

## 2020-06-19 LAB — HEPATITIS C ANTIBODY: Hep C Virus Ab: <0.1 s/co ratio (ref 0.0–0.9)

## 2020-06-19 NOTE — Telephone Encounter (Signed)
Called and advised pt would need care for 2 wks postop

## 2020-06-25 ENCOUNTER — Encounter: Payer: Self-pay | Admitting: Nurse Practitioner

## 2020-06-30 ENCOUNTER — Ambulatory Visit (INDEPENDENT_AMBULATORY_CARE_PROVIDER_SITE_OTHER): Payer: Medicaid Other | Admitting: Specialist

## 2020-06-30 ENCOUNTER — Encounter: Payer: Self-pay | Admitting: Specialist

## 2020-06-30 ENCOUNTER — Telehealth: Payer: Self-pay | Admitting: Specialist

## 2020-06-30 ENCOUNTER — Ambulatory Visit (INDEPENDENT_AMBULATORY_CARE_PROVIDER_SITE_OTHER): Payer: Medicaid Other

## 2020-06-30 ENCOUNTER — Other Ambulatory Visit: Payer: Self-pay

## 2020-06-30 VITALS — BP 139/83 | HR 62 | Ht 64.0 in | Wt 169.5 lb

## 2020-06-30 DIAGNOSIS — M48062 Spinal stenosis, lumbar region with neurogenic claudication: Secondary | ICD-10-CM | POA: Diagnosis not present

## 2020-06-30 DIAGNOSIS — R29898 Other symptoms and signs involving the musculoskeletal system: Secondary | ICD-10-CM

## 2020-06-30 DIAGNOSIS — M4316 Spondylolisthesis, lumbar region: Secondary | ICD-10-CM

## 2020-06-30 DIAGNOSIS — M25562 Pain in left knee: Secondary | ICD-10-CM | POA: Diagnosis not present

## 2020-06-30 DIAGNOSIS — M4310 Spondylolisthesis, site unspecified: Secondary | ICD-10-CM | POA: Diagnosis not present

## 2020-06-30 MED ORDER — TRAMADOL-ACETAMINOPHEN 37.5-325 MG PO TABS: 1.0000 | ORAL_TABLET | Freq: Four times a day (QID) | ORAL | 0 refills | Status: AC | PRN

## 2020-06-30 NOTE — Progress Notes (Signed)
Office Visit Note   Patient: Vicki Hall           Date of Birth: [birth year]           MRN: [id number] Visit Date: 06/30/2020              Requested by: Minette Brine, [address],  [place] [address] PCP: Minette Brine, FNP   Assessment & Plan: Visit Diagnoses:  1. Spondylolisthesis, lumbar region   2. Spinal stenosis of lumbar region with neurogenic claudication   3. Retrolisthesis of vertebrae   4. Weakness of left leg     Plan:  Plan:Avoid bending, stooping and avoid lifting weights greater than 10-15lbs. Avoid prolong standing and walking.  Surgery scheduling secretary Kandice Hams, will call you in the next week to schedule for surgery.  Surgery recommended is a one level lumbar fusion L4-5 this would be done with rods, screws and cages with local bone graft and allograft (donor bone graft).Also will perform bilateral partial hemilaminectomies at L3-4 also using the operating room microscope.  Takeultracet and gabapentinfor for pain. Risk of surgery includes risk of infection 1 in 200 patients, bleeding 1/2% chance you would need a transfusion. Risk to the nerves is one in 10,000. You will need to use a brace for 3 months and wean from the brace on the 4th month. Expect improved walking and standing tolerance. Expect relief of leg pain but numbness  may persist depending on the length and degree of pressure that has been present. Family prefers schedule early January surgery and this is scheduled for 07/30/2020. Knee is suffering from osteoarthritis, only real proven treatments are Weight loss, NSIADs like tylenol and exercise. Well padded shoes help. Ice the knee that is suffering from osteoarthritis.  Well padded shoes help. Ice the knee 2-3 times a day 15-20 mins at a time.-3 times a day 15-20 mins at a time. Hot showers in the AM.  Injection with steroid may be of benefit. Hemp CBD capsules, amazon.com 5,000-7,000 mg per bottle,  60 capsules per bottle, take one capsule twice a day. Cane in the right hand to use with left leg weight bearing. Follow-Up Instructions: No follow-ups on file.  Follow-Up Instructions: No follow-ups on file.   Orders:  No orders of the defined types were placed in this encounter.  No orders of the defined types were placed   Follow-Up Instructions: Return in about 6 weeks (around 08/11/2020).   Orders:  Orders Placed This Encounter  Procedures  . XR Lumbar Spine 2-3 Views   Meds ordered this encounter  Medications  . traMADol-acetaminophen (ULTRACET) 37.5-325 MG tablet    Sig: Take 1 tablet by mouth every 6 (six) hours as needed.    Dispense:  30 tablet    Refill:  0      Procedures: No procedures performed   Clinical Data: No additional findings.   Subjective: Chief Complaint  Patient presents with  . Lower Back - Follow-up, Pain    Had a fall, 06/29/20, was getting out of bed and left knee gave out and she fell back on her back.    64 year old female with history of L4-5 spondylolisthesis with spinal stenosis. She is scheduled for surgery 07/30/2020. This past weekend she was getting up from bed and the left leg gave away on her and she reports that she fell. No pain due to pain but is not comfortable with the weakness she is having in the left  leg and discomfort with the left. Both legs are numb and she is having difficulty with walking and standing.    Review of Systems  Constitutional: Negative.   HENT: Negative.   Eyes: Negative.   Respiratory: Negative.   Cardiovascular: Negative.   Gastrointestinal: Negative.   Endocrine: Negative.   Genitourinary: Negative.   Musculoskeletal: Positive for back pain and gait problem. Negative for arthralgias, joint swelling, myalgias, neck pain and neck stiffness.  Skin: Negative.  Negative for color change, pallor, rash and wound.  Allergic/Immunologic: Negative.  Negative for environmental allergies, food allergies  and immunocompromised state.  Neurological: Negative for dizziness, tremors, seizures, syncope, facial asymmetry, speech difficulty, weakness, light-headedness, numbness and headaches.  Hematological: Negative.  Negative for adenopathy. Does not bruise/bleed easily.  Psychiatric/Behavioral: Negative.  Negative for agitation, behavioral problems, confusion, decreased concentration, dysphoric mood, hallucinations, self-injury, sleep disturbance and suicidal ideas. The patient is not nervous/anxious and is not hyperactive.      Objective: Vital Signs: BP 139/83 (BP Location: Left Arm, Patient Position: Sitting)   Pulse 62   Ht 5\' 4"  (1.626 m)   Wt 169 lb 8 oz (76.9 kg)   BMI 29.09 kg/m   Physical Exam Constitutional:      Appearance: She is well-developed and well-nourished.  HENT:     Head: Normocephalic and atraumatic.  Eyes:     Extraocular Movements: EOM normal.     Pupils: Pupils are equal, round, and reactive to light.  Pulmonary:     Effort: Pulmonary effort is normal.     Breath sounds: Normal breath sounds.  Abdominal:     General: Bowel sounds are normal.     Palpations: Abdomen is soft.  Musculoskeletal:     Cervical back: Normal range of motion and neck supple.     Lumbar back: Negative right straight leg raise test and negative left straight leg raise test.  Skin:    General: Skin is warm and dry.  Neurological:     Mental Status: She is alert and oriented to person, place, and time.  Psychiatric:        Mood and Affect: Mood and affect normal.        Behavior: Behavior normal.        Thought Content: Thought content normal.        Judgment: Judgment normal.     Back Exam   Tenderness  The patient is experiencing tenderness in the lumbar.  Range of Motion  Extension: abnormal  Flexion: abnormal  Lateral bend right: abnormal  Lateral bend left: abnormal  Rotation right: abnormal  Rotation left: abnormal   Muscle Strength  Right Quadriceps:  5/5   Left Quadriceps:  4/5  Right Hamstrings:  5/5  Left Hamstrings:  4/5   Tests  Straight leg raise right: negative Straight leg raise left: negative  Reflexes  Patellar: 0/4 Achilles: 0/4  Other  Toe walk: abnormal Heel walk: abnormal Sensation: decreased Erythema: no back redness  Comments:  Left knee extension with weakness 4/5.       Specialty Comments:  No specialty comments available.  Imaging: XR Lumbar Spine 2-3 Views  Result Date: 06/30/2020 AP and lateral flexion and extension radiographs demonstrate grade1-2 anterolisthesis L4-5 and trace anterolisthesis at L5-S1. There is severe spondylosis changes at the L4-5 and L5-S1 levels. The SI joints are well maintained. The hips are without abnormality, no acute changes, no fracture of dislocation or subluxation.      PMFS History: Patient Active Problem List  Diagnosis Date Noted  . Educated about COVID-19 virus infection 04/08/2020  . Precordial chest pain 04/08/2020  . Spinal stenosis of lumbar region 02/14/2020  . Retrolisthesis of vertebrae 02/14/2020  . Elevated cholesterol 01/24/2020  . Anterolisthesis 04/10/2019  . HNP (herniated nucleus pulposus), lumbar 04/10/2019  . Unstable angina ([place]) 04/10/2018  . Pre-operative laboratory examination 04/10/2018  . Other fatigue 04/10/2018  . Abnormal stress test 04/10/2018  . Dyslipidemia 04/10/2018  . Essential hypertension 04/10/2018   Past Medical History:  Diagnosis Date  . Anemia    4-5 yrs ago   . Arthritis    back   . Back pain   . Cataract    right eye, lens implant   . Constipation    uses stool softener daily but will go 5 days between stools   . Hyperlipidemia   . Hypertension   . Stroke [facility])    [phone]  . Unstable angina ([facility])     Family History  Problem Relation Age of Onset  . Pancreatic cancer Mother   . Lung cancer Mother   . Liver cancer Father   . Esophageal cancer Brother   . Colon cancer Neg Hx   . Colon polyps Neg  Hx   . Rectal cancer Neg Hx   . Stomach cancer Neg Hx     Past Surgical History:  Procedure Laterality Date  . APPENDECTOMY    . LEFT HEART CATH AND CORONARY ANGIOGRAPHY N/A 04/13/2018   Procedure: LEFT HEART CATH AND CORONARY ANGIOGRAPHY;  Surgeon: Nelva Bush, MD;  Location: [facility];  Service: Cardiovascular;  Laterality: N/A;   Social History   Occupational History  . Not on file  Tobacco Use  . Smoking status: Never Smoker  . Smokeless tobacco: Never Used  Substance and Sexual Activity  . Alcohol use: Never  . Drug use: Never  . Sexual activity: Not on file

## 2020-06-30 NOTE — Telephone Encounter (Signed)
Pts daughter London Sheer called stating the pt fell yesterday and her back pain has now gotten worse; she would like to know if Dr.Nitka could see her either this morning or tomorrow morning? Pt would like a CB to let her know.  [phone]

## 2020-06-30 NOTE — Patient Instructions (Signed)
  Plan:Avoid bending, stooping and avoid lifting weights greater than 10-15lbs. Avoid prolong standing and walking.  Surgery scheduling secretary Kandice Hams, will call you in the next week to schedule for surgery.  Surgery recommended is a one level lumbar fusion L4-5 this would be done with rods, screws and cages with local bone graft and allograft (donor bone graft).Also will perform bilateral partial hemilaminectomies at L3-4 also using the operating room microscope.  Takeultracet and gabapentinfor for pain. Risk of surgery includes risk of infection 1 in 200 patients, bleeding 1/2% chance you would need a transfusion. Risk to the nerves is one in 10,000. You will need to use a brace for 3 months and wean from the brace on the 4th month. Expect improved walking and standing tolerance. Expect relief of leg pain but numbness  may persist depending on the length and degree of pressure that has been present. Family prefers schedule early January surgery and she is scheduled for 07/30/2020.  Knee is suffering from osteoarthritis, only real proven treatments are Weight loss, NSIADs like tylenol and exercise. Well padded shoes help. Ice the knee that is suffering from osteoarthritis.  Well padded shoes help. Ice the knee 2-3 times a day 15-20 mins at a time.-3 times a day 15-20 mins at a time. Hot showers in the AM.  Injection with steroid may be of benefit. Hemp CBD capsules, amazon.com 5,000-7,000 mg per bottle, 60 capsules per bottle, take one capsule twice a day. Cane in the right hand to use with left leg weight bearing. Follow-Up Instructions: No follow-ups on file.  Follow-Up Instructions: No follow-ups on file.   Orders:  No orders of the defined types were placed in this encounter.  No orders of the defined types were placed

## 2020-06-30 NOTE — Telephone Encounter (Signed)
Patient has appt on 06/30/20

## 2020-07-09 ENCOUNTER — Other Ambulatory Visit: Payer: Self-pay

## 2020-07-24 ENCOUNTER — Ambulatory Visit: Payer: Medicaid Other | Admitting: Surgery

## 2020-07-29 ENCOUNTER — Ambulatory Visit: Admit: 2020-07-29 | Payer: Medicaid Other | Admitting: Specialist

## 2020-07-29 SURGERY — POSTERIOR LUMBAR FUSION 1 LEVEL
Anesthesia: General

## 2020-10-21 ENCOUNTER — Ambulatory Visit: Payer: Medicaid Other | Admitting: Nurse Practitioner

## 2020-10-21 ENCOUNTER — Other Ambulatory Visit: Payer: Self-pay

## 2020-10-21 ENCOUNTER — Encounter: Payer: Self-pay | Admitting: Nurse Practitioner

## 2020-10-21 VITALS — BP 112/60 | HR 69 | Temp 98.5°F | Ht 63.2 in | Wt 162.4 lb

## 2020-10-21 DIAGNOSIS — R221 Localized swelling, mass and lump, neck: Secondary | ICD-10-CM

## 2020-10-21 DIAGNOSIS — M89319 Hypertrophy of bone, unspecified shoulder: Secondary | ICD-10-CM | POA: Diagnosis not present

## 2020-10-21 NOTE — Progress Notes (Signed)
I,Yamilka Roman [employer] as a Education administrator for [employer], FNP.,have documented all relevant documentation on the behalf of Minette Brine, FNP,as directed by  Minette Brine, FNP while in the presence of Minette Brine, [place]. This visit occurred during the SARS-CoV-2 public health emergency.  Safety protocols were in place, including screening questions prior to the visit, additional usage of staff PPE, and extensive cleaning of exam room while observing appropriate contact time as indicated for disinfecting solutions.  Subjective:     Patient ID: Vicki Hall , female    DOB: [birth year] , 65 y.o.   MRN: [id number]   Chief Complaint  Patient presents with  . Follow-up    Patient went to the urgent care on Thursday because she noticed a bump on her colar bone. She does report having pain.     HPI  Patient went to [facility] on Thursday for a bump she noticed on her the right side of her neck. They told her it may be her thyroid they were unable to doing any imaging studies due to patient having medicaid. She was told to come to her primary care office. Patient stated the bump does hurt her.     Past Medical History:  Diagnosis Date  . Anemia    4-5 yrs ago   . Arthritis    back   . Back pain   . Cataract    right eye, lens implant   . Constipation    uses stool softener daily but will go 5 days between stools   . Hyperlipidemia   . Hypertension   . Stroke [facility])    [phone]  . Unstable angina (HCC)      Family History  Problem Relation Age of Onset  . Pancreatic cancer Mother   . Lung cancer Mother   . Liver cancer Father   . Esophageal cancer Brother   . Colon cancer Neg Hx   . Colon polyps Neg Hx   . Rectal cancer Neg Hx   . Stomach cancer Neg Hx      Current Outpatient Medications:  .  atorvastatin (LIPITOR) 20 MG tablet, Take 1 tablet (20 mg total) by mouth daily., Disp: 90 tablet, Rfl: 1 .  bisoprolol-hydrochlorothiazide (ZIAC) 2.5-6.25 MG tablet, Take 2  tablets by mouth daily., Disp: 180 tablet, Rfl: 0 .  diclofenac Sodium (VOLTAREN) 1 % GEL, Apply 2 g topically 4 (four) times daily., Disp: 100 g, Rfl: 2 .  Docusate Sodium (DULCOLAX STOOL SOFTENER PO), Take by mouth daily., Disp: , Rfl:  .  gabapentin (NEURONTIN) 100 MG capsule, Take 1 capsule (100 mg total) by mouth at bedtime., Disp: 30 capsule, Rfl: 3 .  methylPREDNISolone (MEDROL DOSEPAK) 4 MG TBPK tablet, Take 6 day medrol dose pak as directed, Disp: 21 tablet, Rfl: 0 .  naproxen sodium (ALEVE) 220 MG tablet, Take 220 mg by mouth., Disp: , Rfl:  .  nitroGLYCERIN 2.5 MG CR capsule, Take 1 capsule (2.5 mg total) by mouth daily., Disp: 90 capsule, Rfl: 1 .  traMADol-acetaminophen (ULTRACET) 37.5-325 MG tablet, Take 1 tablet by mouth every 6 (six) hours as needed., Disp: 30 tablet, Rfl: 0   No Known Allergies   Review of Systems   Today's Vitals   10/21/20 1215  BP: 112/60  Pulse: 69  Temp: 98.5 F (36.9 C)  TempSrc: Oral  Weight: 162 lb 6.4 oz (73.7 kg)  Height: 5' 3.2" (1.605 m)  PainSc: 6   PainLoc: Neck   Body mass index  is 28.59 kg/m.   Objective:  Physical Exam Constitutional:      General: She is not in acute distress.    Appearance: Normal appearance.  Musculoskeletal:        General: Swelling (right clavicle area has a raised area) and tenderness (right clavicle) present.     Comments: Right clavicle area is slightly enlarged and tender to touch.  She has pain with range of motion extension, flexion.    Neurological:     General: No focal deficit present.     Mental Status: She is alert.     Cranial Nerves: No cranial nerve deficit.     Comments: Daughter is here to interpret for her.  Psychiatric:        Mood and Affect: Mood normal.        Behavior: Behavior normal.        Thought Content: Thought content normal.        Judgment: Judgment normal.         Assessment And Plan:     1. Localized swelling, mass or lump of neck  She has right neck  enlargement  Will check thyroid levels are ultrasound  She has swelling to her right clavicular area as well - TSH - US THYROID; Future - US Soft Tissue Head/Neck (NON-THYROID); Future - CBC with Differential/Platelet  2. Clavicle enlargement  Firm enlargement to right clavicle with tenderness  Encouraged to apply cool compress  1-2 times a day  Pain with range of motion - DG Clavicle Right; Future - CBC with Differential/Platelet     Patient was given opportunity to ask questions. Patient verbalized understanding of the plan and was able to repeat key elements of the plan. All questions were answered to their satisfaction.  Minette Brine, FNP   I, Minette Brine, FNP, have reviewed all documentation for this visit. The documentation on 10/21/20 for the exam, diagnosis, procedures, and orders are all accurate and complete.   IF YOU HAVE BEEN REFERRED TO A SPECIALIST, IT MAY TAKE 1-2 WEEKS TO SCHEDULE/PROCESS THE REFERRAL. IF YOU HAVE NOT HEARD FROM US/SPECIALIST IN TWO WEEKS, PLEASE GIVE [place] A CALL AT [phone].   THE PATIENT IS ENCOURAGED TO PRACTICE SOCIAL DISTANCING DUE TO THE COVID-19 PANDEMIC.

## 2020-10-21 NOTE — Patient Instructions (Signed)
Use cool compress 1-2 times a day and use pain gel/cream for discomfort

## 2020-10-22 ENCOUNTER — Other Ambulatory Visit: Payer: Self-pay | Admitting: Nurse Practitioner

## 2020-10-22 DIAGNOSIS — I10 Essential (primary) hypertension: Secondary | ICD-10-CM

## 2020-10-22 LAB — CBC WITH DIFFERENTIAL/PLATELET
Basophils Absolute: 0 10*3/uL (ref 0.0–0.2)
Basos: 1 %
EOS (ABSOLUTE): 0.1 10*3/uL (ref 0.0–0.4)
Eos: 1 %
Hematocrit: 41.2 % (ref 34.0–46.6)
Hemoglobin: 13.1 g/dL (ref 11.1–15.9)
Immature Grans (Abs): 0 10*3/uL (ref 0.0–0.1)
Immature Granulocytes: 0 %
Lymphocytes Absolute: 2.5 10*3/uL (ref 0.7–3.1)
Lymphs: 56 %
MCH: 25.3 pg — ABNORMAL LOW (ref 26.6–33.0)
MCHC: 31.8 g/dL (ref 31.5–35.7)
MCV: 80 fL (ref 79–97)
Monocytes Absolute: 0.4 10*3/uL (ref 0.1–0.9)
Monocytes: 9 %
Neutrophils Absolute: 1.5 10*3/uL (ref 1.4–7.0)
Neutrophils: 33 %
Platelets: 291 10*3/uL (ref 150–450)
RBC: 5.17 x10E6/uL (ref 3.77–5.28)
RDW: 13.6 % (ref 11.7–15.4)
WBC: 4.4 10*3/uL (ref 3.4–10.8)

## 2020-10-22 LAB — TSH: TSH: 2.95 u[IU]/mL (ref 0.450–4.500)

## 2020-11-05 ENCOUNTER — Ambulatory Visit
Admission: RE | Admit: 2020-11-05 | Discharge: 2020-11-05 | Disposition: A | Discharge status: Home / Self Care | Payer: Medicaid Other | Source: Ambulatory Visit | Attending: Nurse Practitioner | Admitting: Nurse Practitioner

## 2020-11-05 DIAGNOSIS — R221 Localized swelling, mass and lump, neck: Secondary | ICD-10-CM | POA: Diagnosis not present

## 2020-11-05 DIAGNOSIS — M89319 Hypertrophy of bone, unspecified shoulder: Secondary | ICD-10-CM

## 2020-11-05 DIAGNOSIS — M89311 Hypertrophy of bone, right shoulder: Secondary | ICD-10-CM | POA: Diagnosis not present

## 2020-12-23 ENCOUNTER — Ambulatory Visit: Payer: Medicaid Other | Admitting: Nurse Practitioner

## 2020-12-30 IMAGING — MG DIGITAL SCREENING BILAT W/ CAD
4 series · 4 of 4 positions shown · non-contrast
Comparison: None.

CLINICAL DATA: Screening.

EXAM:
DIGITAL SCREENING BILATERAL MAMMOGRAM WITH CAD

[R CC]
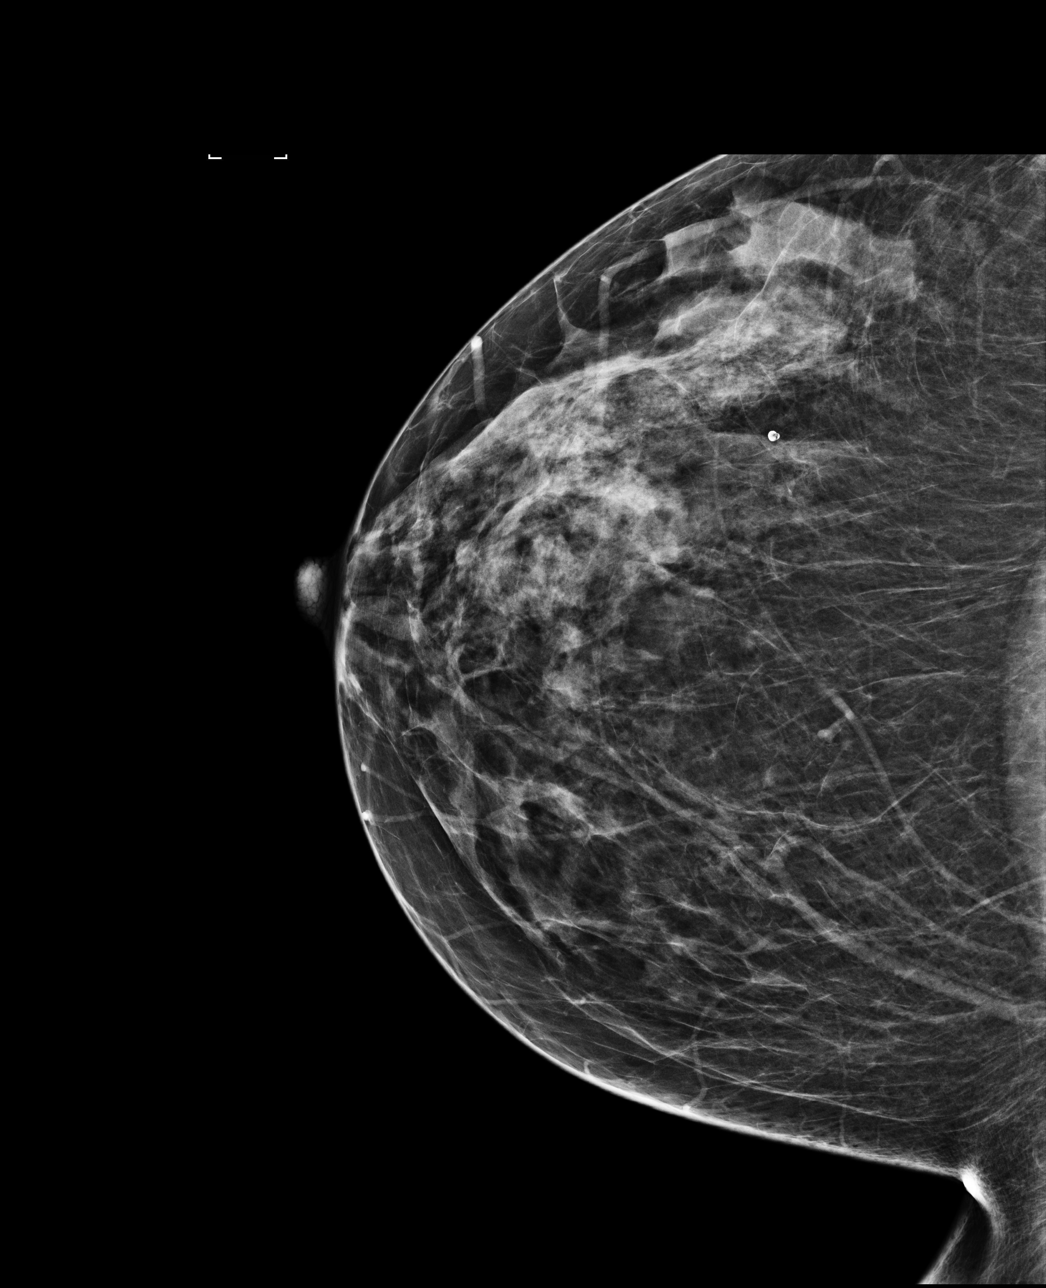

[R MLO]
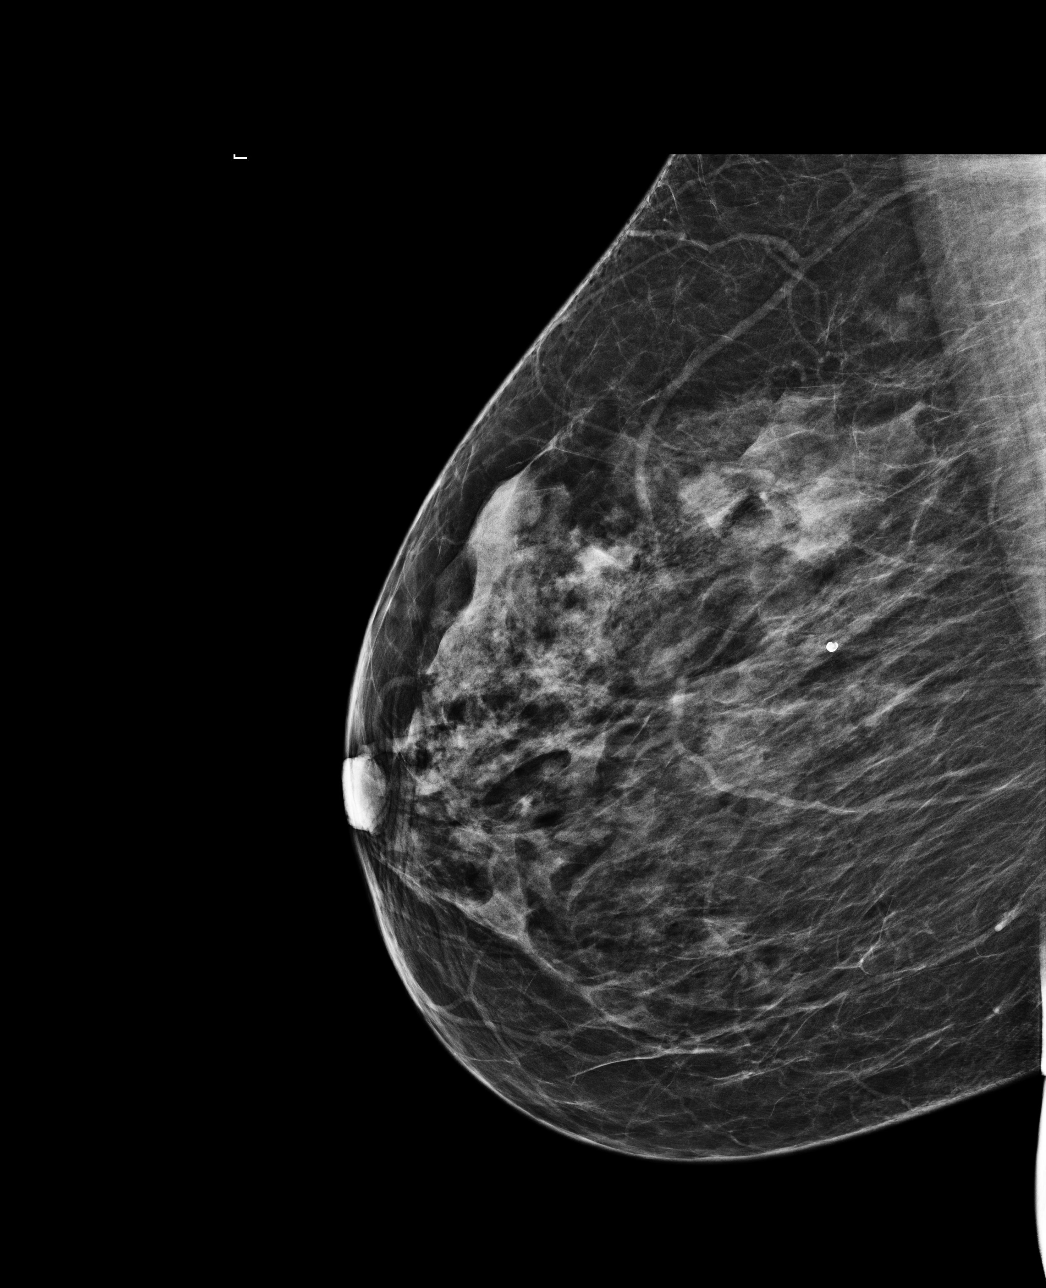

[L MLO]
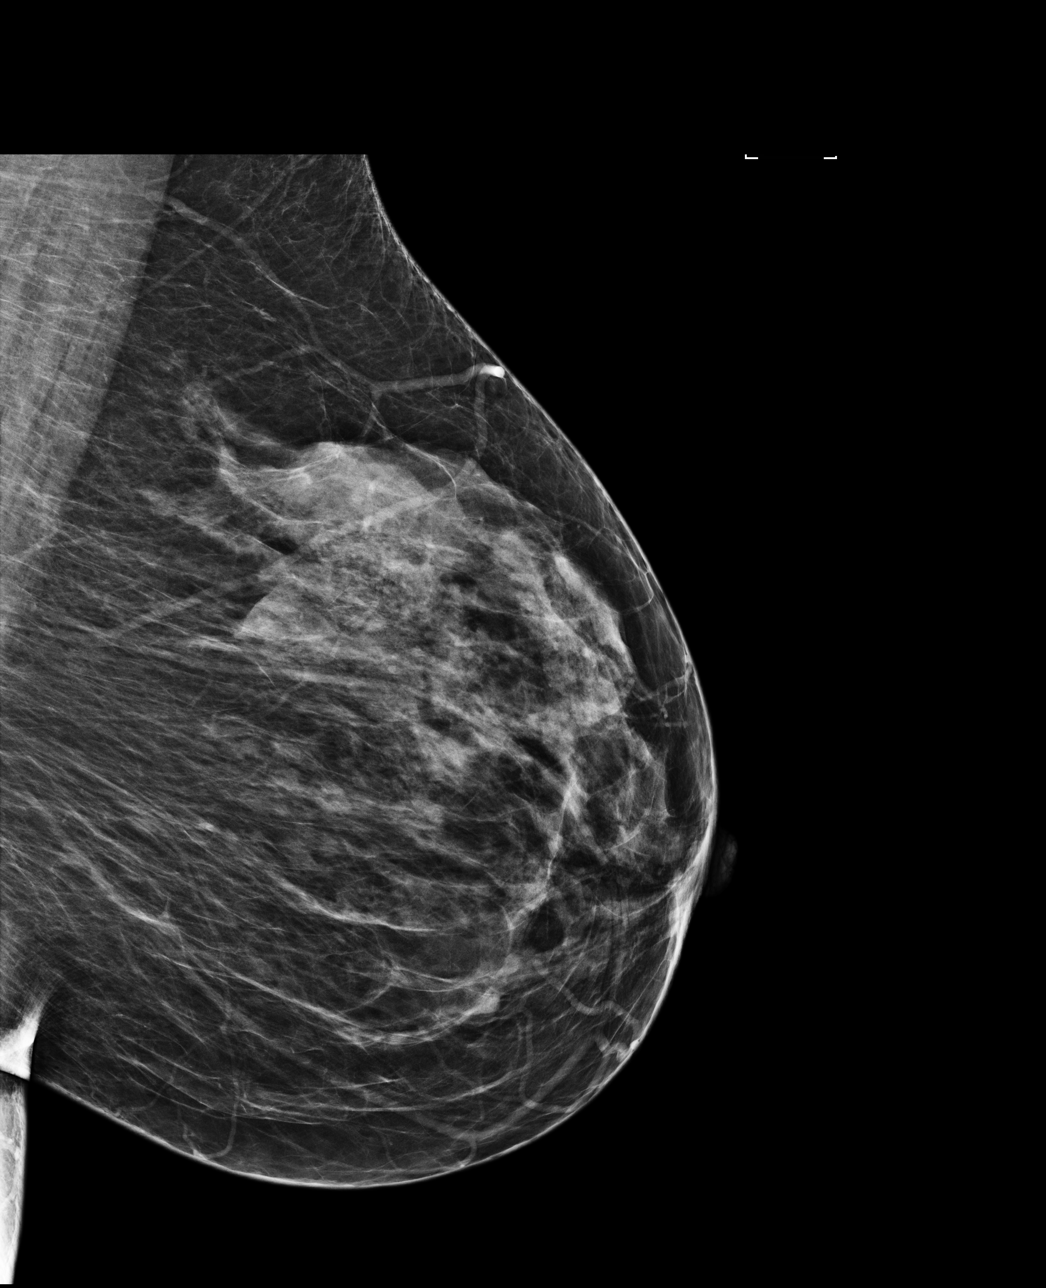

[L CC]
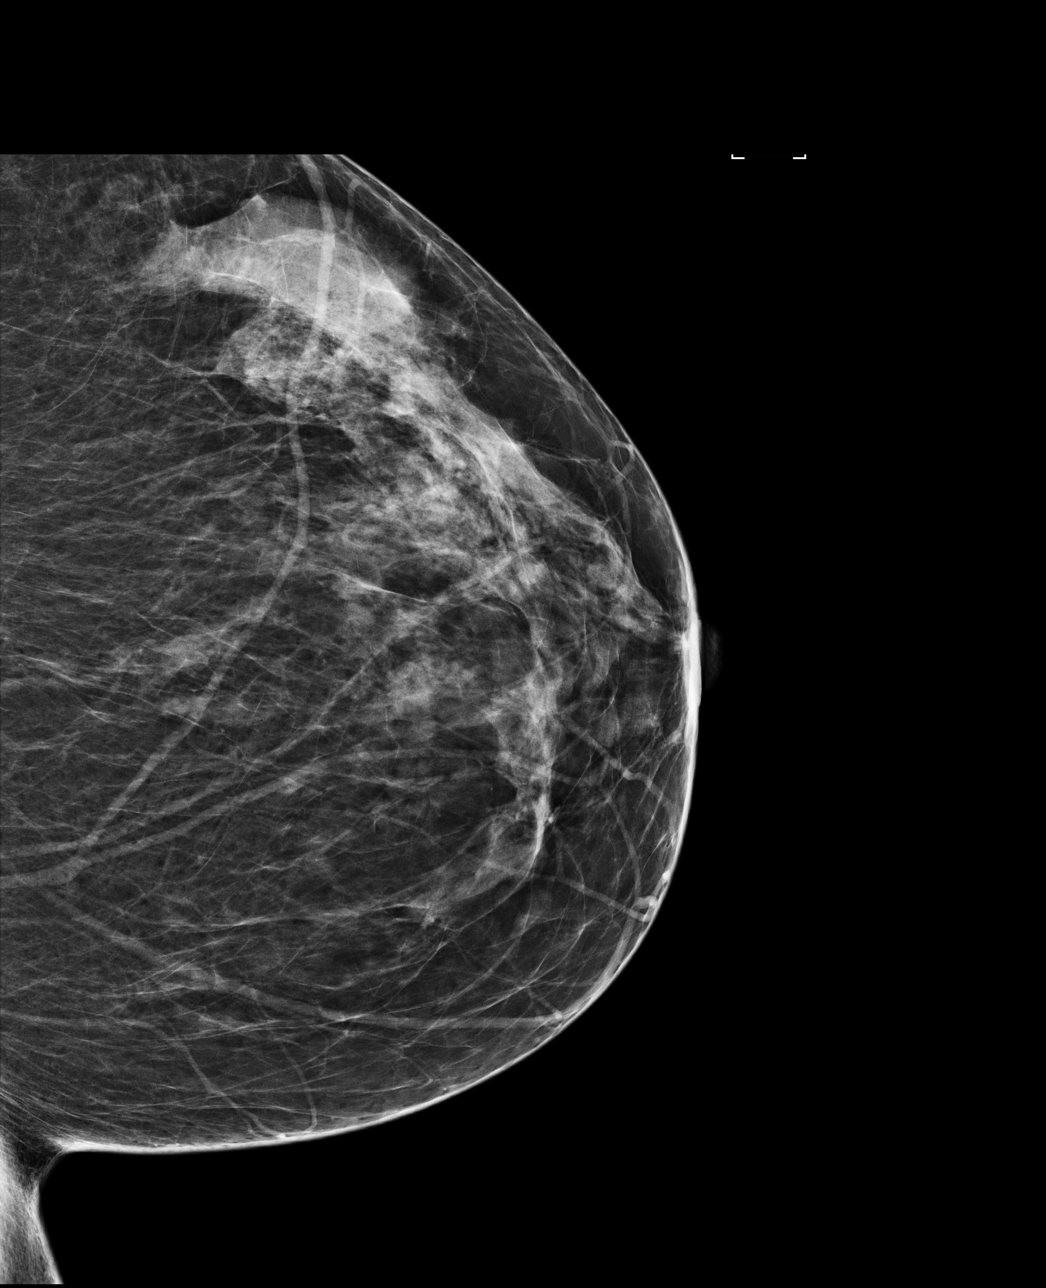

[4 of 4 positions shown; findings below may reference images not displayed]

ACR Breast Density Category c: The breast tissue is heterogeneously
dense, which may obscure small masses
FINDINGS: There are no findings suspicious for malignancy. Images were
processed with CAD.
IMPRESSION: No mammographic evidence of malignancy. A result letter of this
screening mammogram will be mailed directly to the patient.

RECOMMENDATION:
Screening mammogram in one year. (Code:U2-0-761)

BI-RADS CATEGORY  1: Negative.

## 2021-05-05 ENCOUNTER — Other Ambulatory Visit: Payer: Self-pay | Admitting: Nurse Practitioner

## 2021-05-05 DIAGNOSIS — I10 Essential (primary) hypertension: Secondary | ICD-10-CM

## 2021-06-12 ENCOUNTER — Other Ambulatory Visit: Payer: Self-pay | Admitting: Nurse Practitioner

## 2021-06-12 DIAGNOSIS — I10 Essential (primary) hypertension: Secondary | ICD-10-CM

## 2021-07-15 ENCOUNTER — Other Ambulatory Visit: Payer: Self-pay | Admitting: Nurse Practitioner

## 2021-07-15 DIAGNOSIS — I10 Essential (primary) hypertension: Secondary | ICD-10-CM

## 2021-09-23 IMAGING — CR DG CLAVICLE*R*
2 series · 2 of 2 positions shown · non-contrast
Comparison: Chest radiograph May 22, 2018

CLINICAL DATA: Right clavicular enlargement medially

EXAM:
RIGHT CLAVICLE - 2+ VIEWS

[w clavicle ap right *]
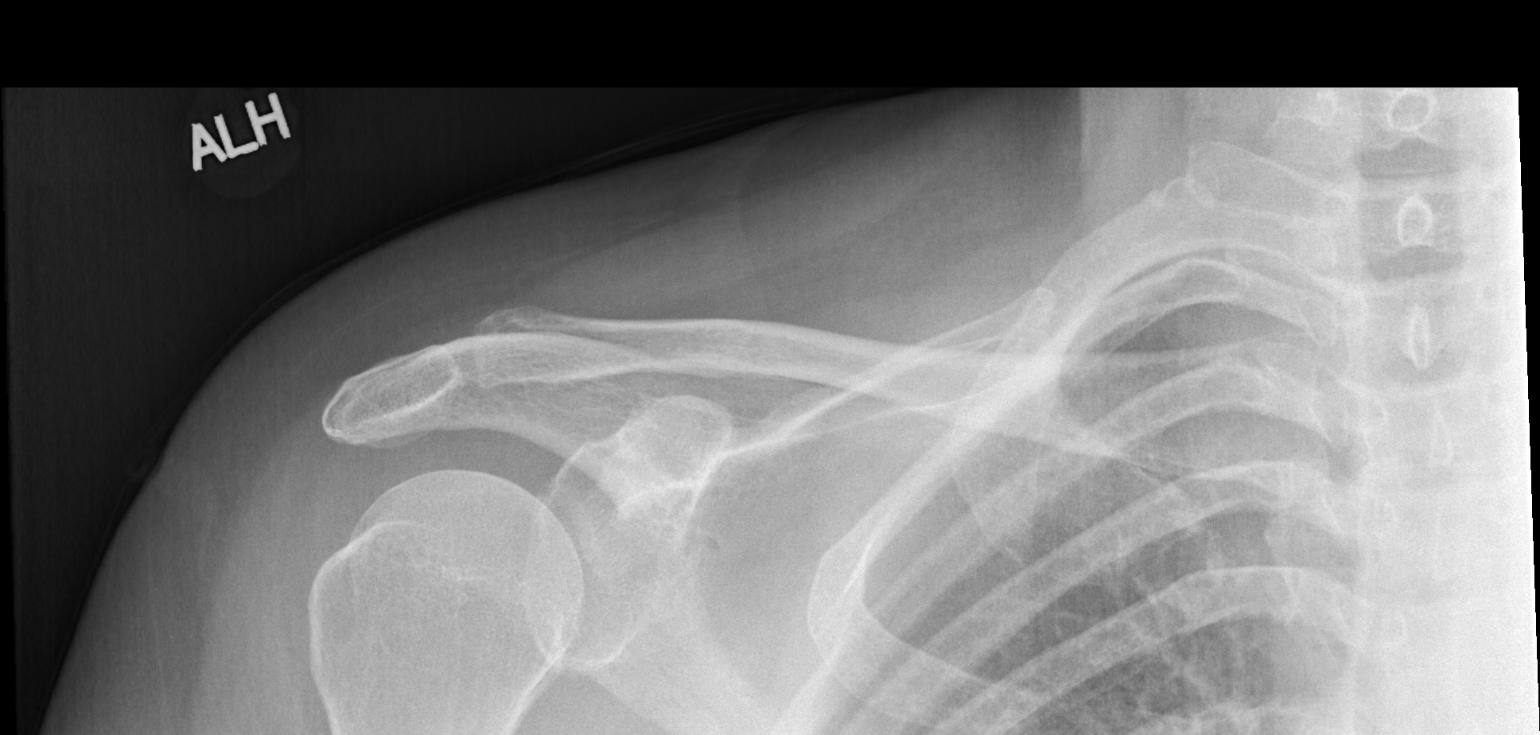

[w clavicle tangential right *]
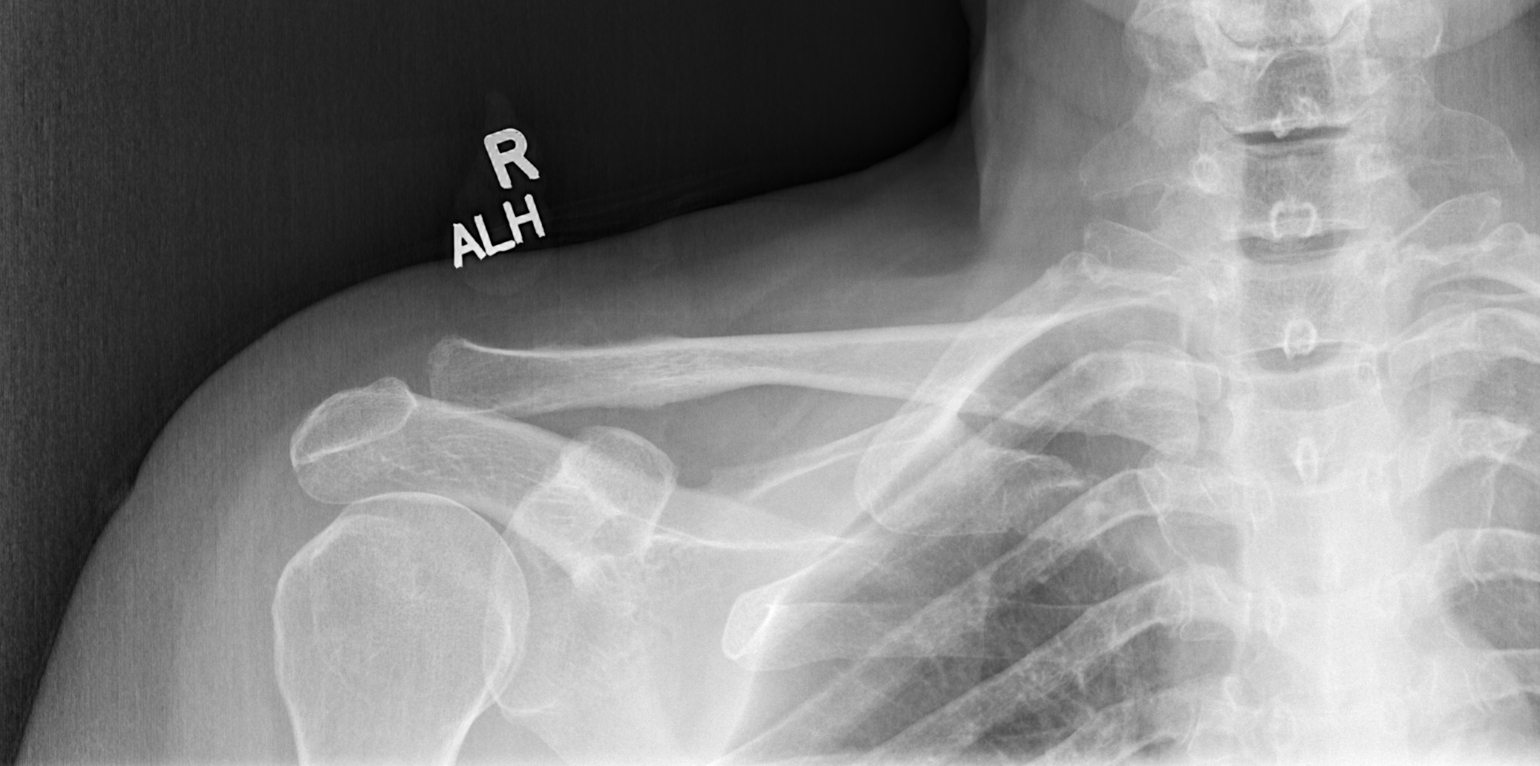

[2 of 2 positions shown; findings below may reference images not displayed]

FINDINGS: Frontal and angled frontal images were obtained. No fracture or
dislocation. Joint spaces appear normal. No bony overgrowth evident
by radiography. Medial clavicles appear symmetric bilaterally. No
erosive change. Visualized upper lung regions clear.
IMPRESSION: No bony lesion evident. No fracture or dislocation. No evident
arthropathy.

## 2021-09-23 IMAGING — US US THYROID
1 series · 14 of 25 positions shown · non-contrast
Comparison: None.

CLINICAL DATA: Right neck lump

EXAM:
THYROID ULTRASOUND
TECHNIQUE: Ultrasound examination of the thyroid gland and adjacent soft
tissues was performed.

[Series 1: us thyroid · 0.08mm/px · 50 acquisitions, 14 frames shown]
[im 1/50]
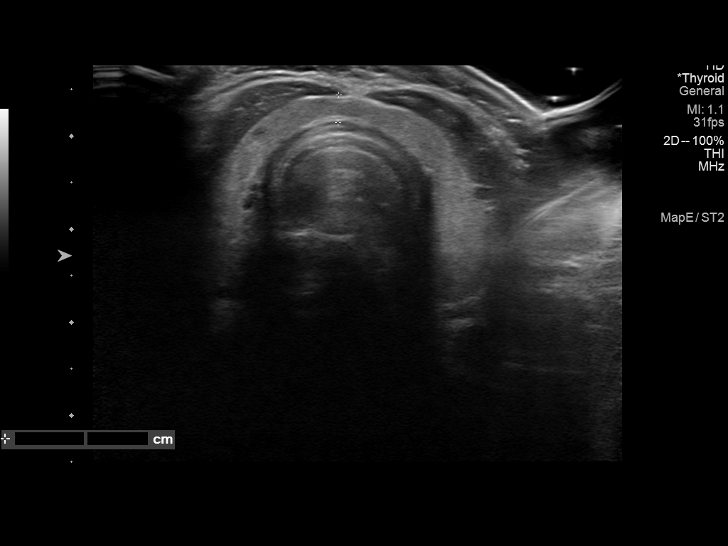
[im 5/50]
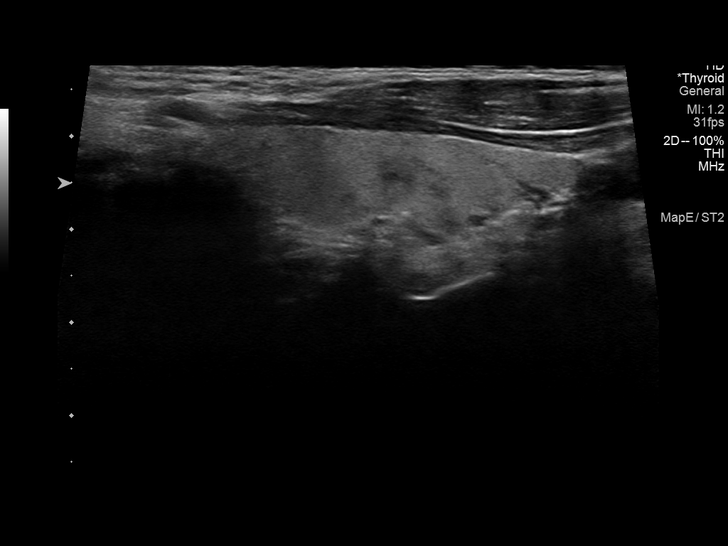
[im 9/50]
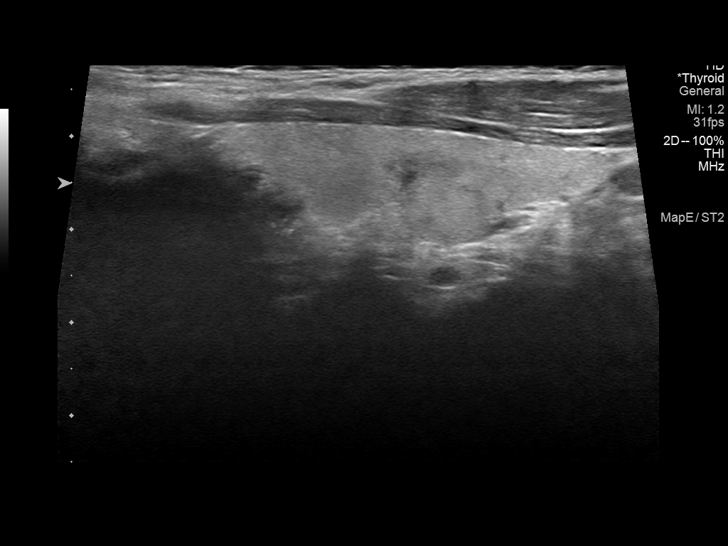
[im 13/50]
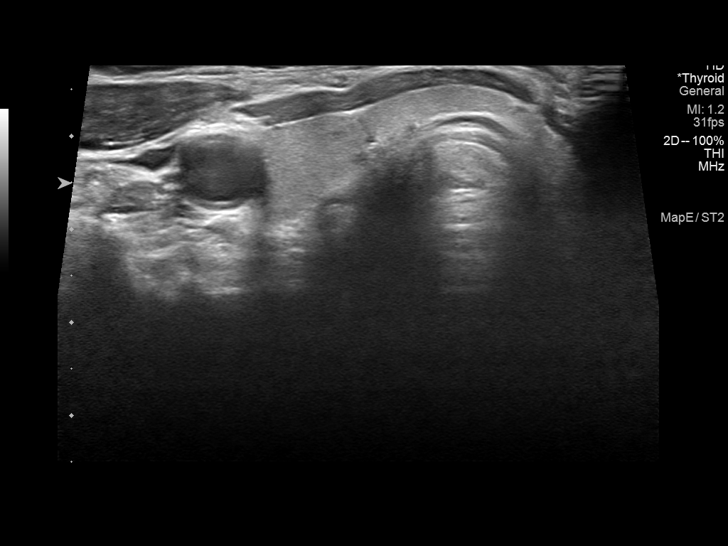
[im 17/50]
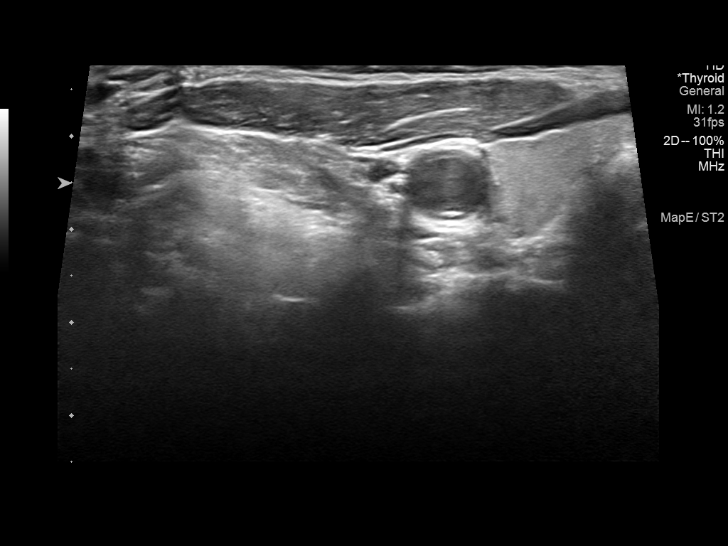
[im 19/50]
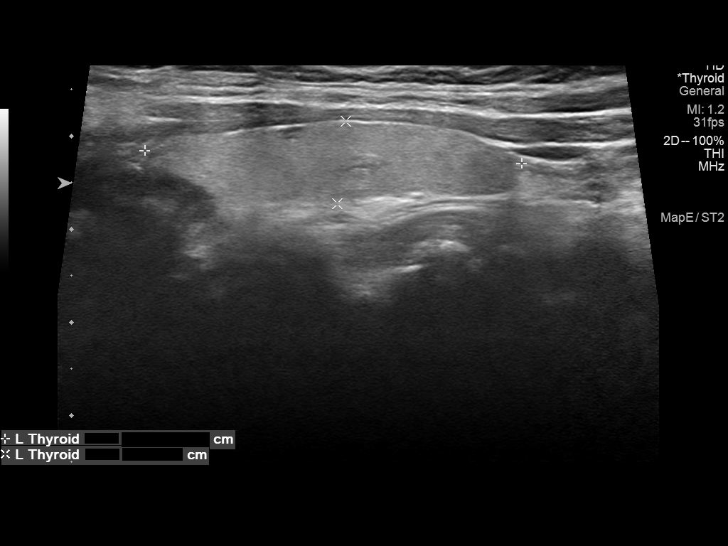
[im 23/50]
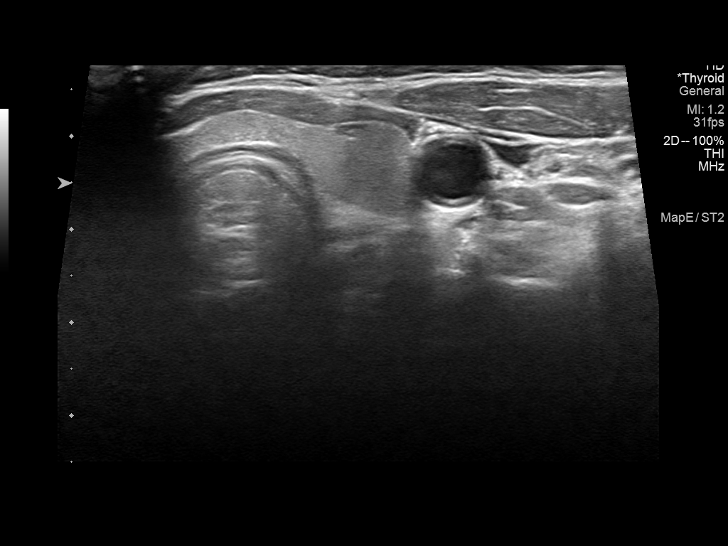
[im 27/50]
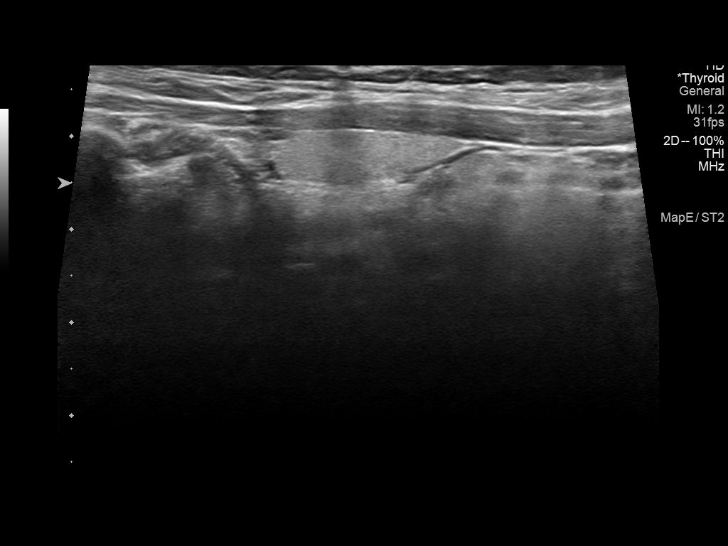
[im 31/50]
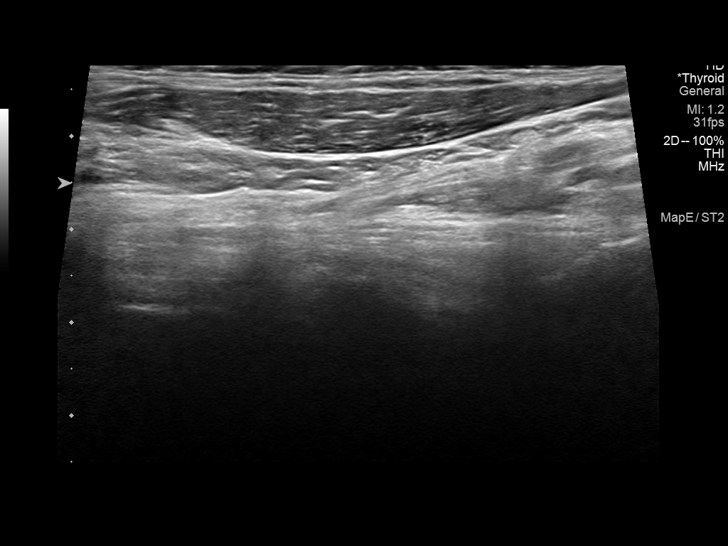
[im 33/50]
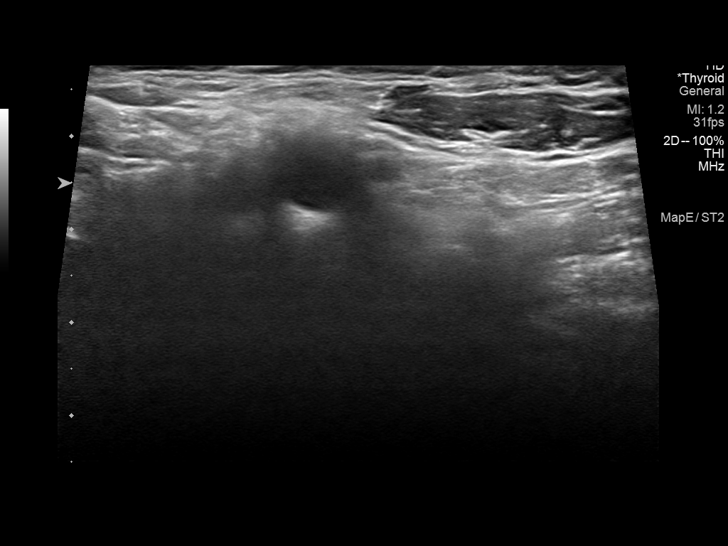
[im 37/50]
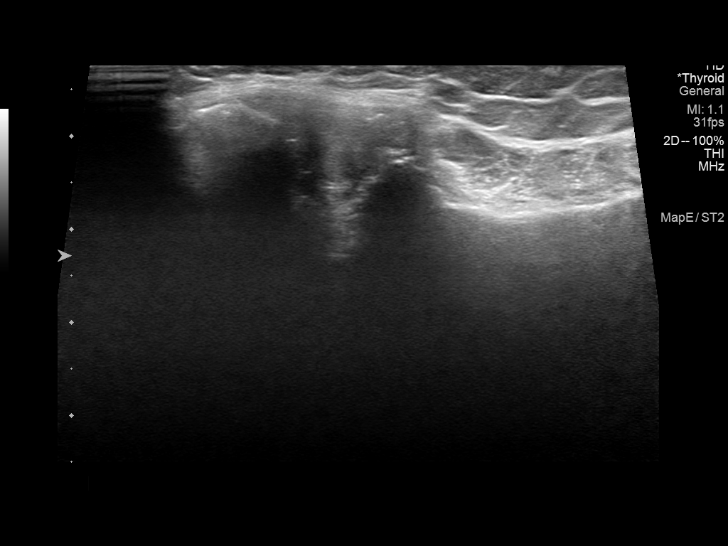
[im 41/50]
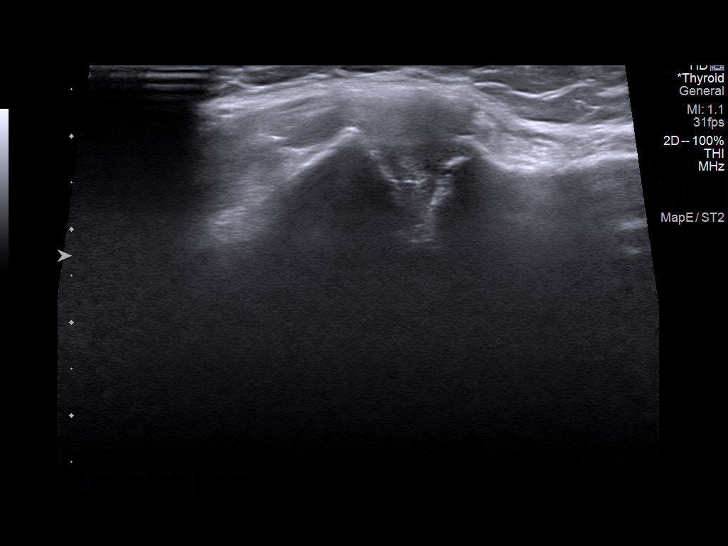
[im 45/50]
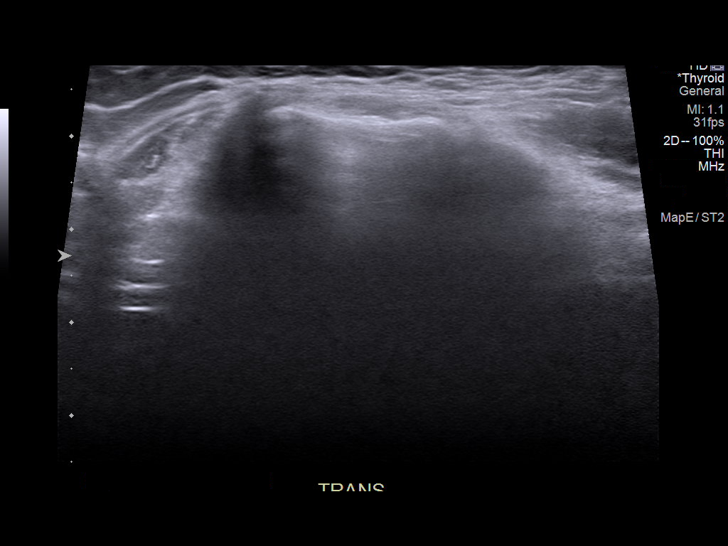
[im 50/50]
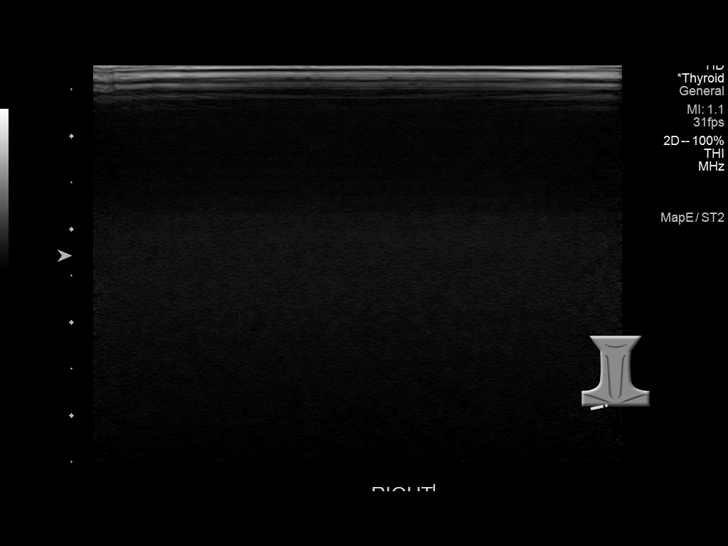

[14 of 25 positions shown; findings below may reference images not displayed]

FINDINGS: Parenchymal Echotexture: Mildly heterogeneous

Isthmus: 0.3 cm

Right lobe: 4.1 x 1.2 x 1.3 cm

Left lobe: 4.0 x 0.9 x 1.2 cm

_________________________________________________________

Estimated total number of nodules >/= 1 cm: 0

Number of spongiform nodules >/=  2 cm not described below (TR1): 0

Number of mixed cystic and solid nodules >/= 1.5 cm not described
below (TR2): 0

_________________________________________________________

No discrete nodules are seen within the thyroid gland.
IMPRESSION: 1. No thyroid nodule.
2. The palpable region in the right neck corresponds to the
sternoclavicular joint. The right sternoclavicular joint capsule
appears thickened compared to the left sternoclavicular joint. If
the patient's symptoms persist or worsen, further evaluation with
MRI of the right sternoclavicular joint should be considered.

The above is in keeping with the ACR TI-RADS recommendations - [HOSPITAL] 9297;[DATE].
